# Patient Record
Sex: Female | Born: 1965
Health system: Southern US, Community
[De-identification: ages and names within clinical notes are randomized; demographics above are authoritative.]

## PROBLEM LIST (undated history)

## (undated) DIAGNOSIS — L732 Hidradenitis suppurativa: Secondary | ICD-10-CM

## (undated) DIAGNOSIS — E119 Type 2 diabetes mellitus without complications: Secondary | ICD-10-CM

## (undated) DIAGNOSIS — L0291 Cutaneous abscess, unspecified: Secondary | ICD-10-CM

## (undated) HISTORY — PX: BACK SURGERY: SHX140

## (undated) HISTORY — DX: Cutaneous abscess, unspecified: L02.91

## (undated) HISTORY — DX: Hidradenitis suppurativa: L73.2

---

## 1999-08-15 ENCOUNTER — Other Ambulatory Visit: Admission: RE | Admit: 1999-08-15 | Discharge: 1999-08-15 | Payer: Self-pay | Admitting: Family Medicine

## 2000-04-28 ENCOUNTER — Emergency Department (HOSPITAL_COMMUNITY): Admission: EM | Admit: 2000-04-28 | Discharge: 2000-04-28 | Payer: Self-pay | Admitting: Emergency Medicine

## 2002-07-23 ENCOUNTER — Inpatient Hospital Stay (HOSPITAL_COMMUNITY): Admission: EM | Admit: 2002-07-23 | Discharge: 2002-07-25 | Payer: Self-pay | Admitting: Psychiatry

## 2003-07-04 ENCOUNTER — Emergency Department (HOSPITAL_COMMUNITY): Admission: EM | Admit: 2003-07-04 | Discharge: 2003-07-04 | Payer: Self-pay | Admitting: Emergency Medicine

## 2011-06-01 DIAGNOSIS — L0291 Cutaneous abscess, unspecified: Secondary | ICD-10-CM | POA: Diagnosis not present

## 2011-06-01 DIAGNOSIS — E1110 Type 2 diabetes mellitus with ketoacidosis without coma: Secondary | ICD-10-CM | POA: Diagnosis not present

## 2011-06-01 DIAGNOSIS — L03319 Cellulitis of trunk, unspecified: Secondary | ICD-10-CM | POA: Diagnosis not present

## 2011-06-01 DIAGNOSIS — F17200 Nicotine dependence, unspecified, uncomplicated: Secondary | ICD-10-CM | POA: Diagnosis not present

## 2011-06-01 DIAGNOSIS — E875 Hyperkalemia: Secondary | ICD-10-CM | POA: Diagnosis not present

## 2011-06-01 DIAGNOSIS — R7309 Other abnormal glucose: Secondary | ICD-10-CM | POA: Diagnosis not present

## 2011-06-01 DIAGNOSIS — A4102 Sepsis due to Methicillin resistant Staphylococcus aureus: Secondary | ICD-10-CM | POA: Diagnosis present

## 2011-06-01 DIAGNOSIS — Z9119 Patient's noncompliance with other medical treatment and regimen: Secondary | ICD-10-CM | POA: Diagnosis not present

## 2011-06-01 DIAGNOSIS — E1310 Other specified diabetes mellitus with ketoacidosis without coma: Secondary | ICD-10-CM | POA: Diagnosis not present

## 2011-06-01 DIAGNOSIS — A419 Sepsis, unspecified organism: Secondary | ICD-10-CM | POA: Diagnosis not present

## 2011-06-01 DIAGNOSIS — L02219 Cutaneous abscess of trunk, unspecified: Secondary | ICD-10-CM | POA: Diagnosis not present

## 2011-06-01 DIAGNOSIS — Z79899 Other long term (current) drug therapy: Secondary | ICD-10-CM | POA: Diagnosis not present

## 2011-06-06 DIAGNOSIS — L03319 Cellulitis of trunk, unspecified: Secondary | ICD-10-CM | POA: Diagnosis not present

## 2011-06-06 DIAGNOSIS — Z794 Long term (current) use of insulin: Secondary | ICD-10-CM | POA: Diagnosis not present

## 2011-06-06 DIAGNOSIS — L02219 Cutaneous abscess of trunk, unspecified: Secondary | ICD-10-CM | POA: Diagnosis not present

## 2011-06-06 DIAGNOSIS — E119 Type 2 diabetes mellitus without complications: Secondary | ICD-10-CM | POA: Diagnosis not present

## 2011-06-06 DIAGNOSIS — A419 Sepsis, unspecified organism: Secondary | ICD-10-CM | POA: Diagnosis not present

## 2011-06-06 DIAGNOSIS — A4902 Methicillin resistant Staphylococcus aureus infection, unspecified site: Secondary | ICD-10-CM | POA: Diagnosis not present

## 2011-06-07 DIAGNOSIS — A4902 Methicillin resistant Staphylococcus aureus infection, unspecified site: Secondary | ICD-10-CM | POA: Diagnosis not present

## 2011-06-07 DIAGNOSIS — Z794 Long term (current) use of insulin: Secondary | ICD-10-CM | POA: Diagnosis not present

## 2011-06-07 DIAGNOSIS — L03319 Cellulitis of trunk, unspecified: Secondary | ICD-10-CM | POA: Diagnosis not present

## 2011-06-07 DIAGNOSIS — A419 Sepsis, unspecified organism: Secondary | ICD-10-CM | POA: Diagnosis not present

## 2011-06-08 DIAGNOSIS — A4902 Methicillin resistant Staphylococcus aureus infection, unspecified site: Secondary | ICD-10-CM | POA: Diagnosis not present

## 2011-06-08 DIAGNOSIS — A419 Sepsis, unspecified organism: Secondary | ICD-10-CM | POA: Diagnosis not present

## 2011-06-08 DIAGNOSIS — L02219 Cutaneous abscess of trunk, unspecified: Secondary | ICD-10-CM | POA: Diagnosis not present

## 2011-06-08 DIAGNOSIS — Z794 Long term (current) use of insulin: Secondary | ICD-10-CM | POA: Diagnosis not present

## 2011-06-12 DIAGNOSIS — L02219 Cutaneous abscess of trunk, unspecified: Secondary | ICD-10-CM | POA: Diagnosis not present

## 2011-06-12 DIAGNOSIS — Z794 Long term (current) use of insulin: Secondary | ICD-10-CM | POA: Diagnosis not present

## 2011-06-12 DIAGNOSIS — A4902 Methicillin resistant Staphylococcus aureus infection, unspecified site: Secondary | ICD-10-CM | POA: Diagnosis not present

## 2011-06-12 DIAGNOSIS — A419 Sepsis, unspecified organism: Secondary | ICD-10-CM | POA: Diagnosis not present

## 2011-06-13 DIAGNOSIS — E119 Type 2 diabetes mellitus without complications: Secondary | ICD-10-CM | POA: Diagnosis not present

## 2011-06-15 DIAGNOSIS — IMO0002 Reserved for concepts with insufficient information to code with codable children: Secondary | ICD-10-CM | POA: Diagnosis not present

## 2011-06-15 DIAGNOSIS — S21209A Unspecified open wound of unspecified back wall of thorax without penetration into thoracic cavity, initial encounter: Secondary | ICD-10-CM | POA: Diagnosis not present

## 2011-06-15 DIAGNOSIS — L988 Other specified disorders of the skin and subcutaneous tissue: Secondary | ICD-10-CM | POA: Diagnosis not present

## 2011-06-19 DIAGNOSIS — A419 Sepsis, unspecified organism: Secondary | ICD-10-CM | POA: Diagnosis not present

## 2011-06-19 DIAGNOSIS — Z794 Long term (current) use of insulin: Secondary | ICD-10-CM | POA: Diagnosis not present

## 2011-06-19 DIAGNOSIS — A4902 Methicillin resistant Staphylococcus aureus infection, unspecified site: Secondary | ICD-10-CM | POA: Diagnosis not present

## 2011-06-19 DIAGNOSIS — L03319 Cellulitis of trunk, unspecified: Secondary | ICD-10-CM | POA: Diagnosis not present

## 2011-06-20 DIAGNOSIS — E119 Type 2 diabetes mellitus without complications: Secondary | ICD-10-CM | POA: Diagnosis not present

## 2011-06-22 DIAGNOSIS — Z794 Long term (current) use of insulin: Secondary | ICD-10-CM | POA: Diagnosis not present

## 2011-06-22 DIAGNOSIS — L03319 Cellulitis of trunk, unspecified: Secondary | ICD-10-CM | POA: Diagnosis not present

## 2011-06-22 DIAGNOSIS — A419 Sepsis, unspecified organism: Secondary | ICD-10-CM | POA: Diagnosis not present

## 2011-06-22 DIAGNOSIS — A4902 Methicillin resistant Staphylococcus aureus infection, unspecified site: Secondary | ICD-10-CM | POA: Diagnosis not present

## 2011-06-27 DIAGNOSIS — E119 Type 2 diabetes mellitus without complications: Secondary | ICD-10-CM | POA: Diagnosis not present

## 2011-06-28 DIAGNOSIS — Z794 Long term (current) use of insulin: Secondary | ICD-10-CM | POA: Diagnosis not present

## 2011-06-28 DIAGNOSIS — A4902 Methicillin resistant Staphylococcus aureus infection, unspecified site: Secondary | ICD-10-CM | POA: Diagnosis not present

## 2011-06-28 DIAGNOSIS — L03319 Cellulitis of trunk, unspecified: Secondary | ICD-10-CM | POA: Diagnosis not present

## 2011-06-28 DIAGNOSIS — A419 Sepsis, unspecified organism: Secondary | ICD-10-CM | POA: Diagnosis not present

## 2011-06-28 DIAGNOSIS — L02219 Cutaneous abscess of trunk, unspecified: Secondary | ICD-10-CM | POA: Diagnosis not present

## 2011-07-04 DIAGNOSIS — A419 Sepsis, unspecified organism: Secondary | ICD-10-CM | POA: Diagnosis not present

## 2011-07-04 DIAGNOSIS — A4902 Methicillin resistant Staphylococcus aureus infection, unspecified site: Secondary | ICD-10-CM | POA: Diagnosis not present

## 2011-07-04 DIAGNOSIS — Z794 Long term (current) use of insulin: Secondary | ICD-10-CM | POA: Diagnosis not present

## 2011-07-04 DIAGNOSIS — L02219 Cutaneous abscess of trunk, unspecified: Secondary | ICD-10-CM | POA: Diagnosis not present

## 2011-07-11 DIAGNOSIS — L03319 Cellulitis of trunk, unspecified: Secondary | ICD-10-CM | POA: Diagnosis not present

## 2011-07-11 DIAGNOSIS — L02219 Cutaneous abscess of trunk, unspecified: Secondary | ICD-10-CM | POA: Diagnosis not present

## 2011-07-11 DIAGNOSIS — E119 Type 2 diabetes mellitus without complications: Secondary | ICD-10-CM | POA: Diagnosis not present

## 2011-07-30 DIAGNOSIS — E119 Type 2 diabetes mellitus without complications: Secondary | ICD-10-CM | POA: Diagnosis not present

## 2011-09-05 DIAGNOSIS — E119 Type 2 diabetes mellitus without complications: Secondary | ICD-10-CM | POA: Diagnosis not present

## 2011-10-04 DIAGNOSIS — E119 Type 2 diabetes mellitus without complications: Secondary | ICD-10-CM | POA: Diagnosis not present

## 2012-09-01 ENCOUNTER — Inpatient Hospital Stay (HOSPITAL_COMMUNITY)
Admission: EM | Admit: 2012-09-01 | Discharge: 2012-09-04 | DRG: 871 | Disposition: A | Discharge status: Home Health | Payer: Medicare Other | Attending: Internal Medicine | Admitting: Internal Medicine

## 2012-09-01 ENCOUNTER — Encounter (HOSPITAL_COMMUNITY): Payer: Self-pay | Admitting: *Deleted

## 2012-09-01 DIAGNOSIS — R000 Tachycardia, unspecified: Secondary | ICD-10-CM | POA: Diagnosis present

## 2012-09-01 DIAGNOSIS — L03319 Cellulitis of trunk, unspecified: Secondary | ICD-10-CM | POA: Diagnosis not present

## 2012-09-01 DIAGNOSIS — E119 Type 2 diabetes mellitus without complications: Secondary | ICD-10-CM

## 2012-09-01 DIAGNOSIS — Z9119 Patient's noncompliance with other medical treatment and regimen: Secondary | ICD-10-CM

## 2012-09-01 DIAGNOSIS — E876 Hypokalemia: Secondary | ICD-10-CM | POA: Diagnosis present

## 2012-09-01 DIAGNOSIS — L0291 Cutaneous abscess, unspecified: Secondary | ICD-10-CM | POA: Diagnosis not present

## 2012-09-01 DIAGNOSIS — E872 Acidosis, unspecified: Secondary | ICD-10-CM

## 2012-09-01 DIAGNOSIS — L02219 Cutaneous abscess of trunk, unspecified: Secondary | ICD-10-CM | POA: Diagnosis present

## 2012-09-01 DIAGNOSIS — A419 Sepsis, unspecified organism: Secondary | ICD-10-CM | POA: Diagnosis present

## 2012-09-01 DIAGNOSIS — E1110 Type 2 diabetes mellitus with ketoacidosis without coma: Secondary | ICD-10-CM | POA: Diagnosis not present

## 2012-09-01 DIAGNOSIS — E871 Hypo-osmolality and hyponatremia: Secondary | ICD-10-CM | POA: Diagnosis present

## 2012-09-01 DIAGNOSIS — Z794 Long term (current) use of insulin: Secondary | ICD-10-CM | POA: Diagnosis not present

## 2012-09-01 DIAGNOSIS — Z91199 Patient's noncompliance with other medical treatment and regimen due to unspecified reason: Secondary | ICD-10-CM

## 2012-09-01 DIAGNOSIS — E1310 Other specified diabetes mellitus with ketoacidosis without coma: Secondary | ICD-10-CM | POA: Diagnosis present

## 2012-09-01 DIAGNOSIS — E8729 Other acidosis: Secondary | ICD-10-CM

## 2012-09-01 HISTORY — DX: Type 2 diabetes mellitus without complications: E11.9

## 2012-09-01 LAB — CBC WITH DIFFERENTIAL/PLATELET
Basophils Absolute: 0 10*3/uL (ref 0.0–0.1)
Basophils Relative: 0 % (ref 0–1)
Eosinophils Absolute: 0 10*3/uL (ref 0.0–0.7)
Eosinophils Relative: 0 % (ref 0–5)
HCT: 41 % (ref 36.0–46.0)
Hemoglobin: 13.7 g/dL (ref 12.0–15.0)
Lymphocytes Relative: 11 % — ABNORMAL LOW (ref 12–46)
Lymphs Abs: 1.8 10*3/uL (ref 0.7–4.0)
MCH: 27 pg (ref 26.0–34.0)
MCHC: 33.4 g/dL (ref 30.0–36.0)
MCV: 80.9 fL (ref 78.0–100.0)
Monocytes Absolute: 1.1 10*3/uL — ABNORMAL HIGH (ref 0.1–1.0)
Monocytes Relative: 7 % (ref 3–12)
Neutro Abs: 13.7 10*3/uL — ABNORMAL HIGH (ref 1.7–7.7)
Neutrophils Relative %: 82 % — ABNORMAL HIGH (ref 43–77)
Platelets: 317 10*3/uL (ref 150–400)
RBC: 5.07 MIL/uL (ref 3.87–5.11)
RDW: 13 % (ref 11.5–15.5)
WBC: 16.7 10*3/uL — ABNORMAL HIGH (ref 4.0–10.5)

## 2012-09-01 LAB — COMPREHENSIVE METABOLIC PANEL
ALT: 11 U/L (ref 0–35)
AST: 12 U/L (ref 0–37)
Albumin: 3.7 g/dL (ref 3.5–5.2)
Alkaline Phosphatase: 131 U/L — ABNORMAL HIGH (ref 39–117)
BUN: 14 mg/dL (ref 6–23)
CO2: 11 mEq/L — ABNORMAL LOW (ref 19–32)
Calcium: 9.7 mg/dL (ref 8.4–10.5)
Chloride: 88 mEq/L — ABNORMAL LOW (ref 96–112)
Creatinine, Ser: 0.61 mg/dL (ref 0.50–1.10)
GFR calc Af Amer: >90 mL/min (ref >90)
GFR calc non Af Amer: >90 mL/min (ref >90)
Glucose, Bld: 394 mg/dL — ABNORMAL HIGH (ref 70–99)
Potassium: 4.3 mEq/L (ref 3.5–5.1)
Sodium: 127 mEq/L — ABNORMAL LOW (ref 135–145)
Total Bilirubin: 0.4 mg/dL (ref 0.3–1.2)
Total Protein: 9 g/dL — ABNORMAL HIGH (ref 6.0–8.3)

## 2012-09-01 LAB — BASIC METABOLIC PANEL
BUN: 11 mg/dL (ref 6–23)
Calcium: 9.1 mg/dL (ref 8.4–10.5)
GFR calc Af Amer: >90 mL/min (ref >90)
GFR calc non Af Amer: >90 mL/min (ref >90)
Glucose, Bld: 348 mg/dL — ABNORMAL HIGH (ref 70–99)
Potassium: 4.2 mEq/L (ref 3.5–5.1)
Sodium: 129 mEq/L — ABNORMAL LOW (ref 135–145)

## 2012-09-01 LAB — LACTIC ACID, PLASMA: Lactic Acid, Venous: 1.3 mmol/L (ref 0.5–2.2)

## 2012-09-01 LAB — ETHANOL: Alcohol, Ethyl (B): <11 mg/dL (ref 0–11)

## 2012-09-01 LAB — GLUCOSE, CAPILLARY: Glucose-Capillary: 373 mg/dL — ABNORMAL HIGH (ref 70–99)

## 2012-09-01 LAB — SALICYLATE LEVEL: Salicylate Lvl: 0 mg/dL — ABNORMAL LOW (ref 2.8–20.0)

## 2012-09-01 MED ORDER — LIDOCAINE-EPINEPHRINE (PF) 2 %-1:200000 IJ SOLN
INTRAMUSCULAR | Status: AC
  Administered 2012-09-01: 20 mL
  Filled 2012-09-01: qty 20

## 2012-09-01 MED ORDER — SODIUM CHLORIDE 0.9 % IV BOLUS (SEPSIS)
1000.0000 mL | Freq: Once | INTRAVENOUS | Status: AC
  Administered 2012-09-01: 1000 mL via INTRAVENOUS

## 2012-09-01 MED ORDER — HYDROMORPHONE HCL PF 1 MG/ML IJ SOLN
1.0000 mg | Freq: Once | INTRAMUSCULAR | Status: AC
  Administered 2012-09-01: 1 mg via INTRAVENOUS
  Filled 2012-09-01: qty 1

## 2012-09-01 MED ORDER — INSULIN ASPART 100 UNIT/ML ~~LOC~~ SOLN
8.0000 [IU] | Freq: Once | SUBCUTANEOUS | Status: AC
  Administered 2012-09-01: 8 [IU] via SUBCUTANEOUS
  Filled 2012-09-01: qty 1

## 2012-09-01 MED ORDER — VANCOMYCIN HCL IN DEXTROSE 1-5 GM/200ML-% IV SOLN
1000.0000 mg | Freq: Once | INTRAVENOUS | Status: AC
  Administered 2012-09-01: 1000 mg via INTRAVENOUS
  Filled 2012-09-01: qty 200

## 2012-09-01 NOTE — ED Notes (Signed)
Stopped taking her meds for diabetes 6 months ago

## 2012-09-01 NOTE — ED Notes (Signed)
edpa in with pt 

## 2012-09-01 NOTE — ED Provider Notes (Signed)
History    This chart was scribed for Benny Lennert, MD by Leone Payor, ED Scribe. This patient was seen in room APA18/APA18 and the patient's care was started 6:36 PM.   CSN: [id number]  Arrival date & time 09/01/12  1658   First MD Initiated Contact with Patient 09/01/12 1833      Chief Complaint  Patient presents with  . Abscess     Patient is a 47 y.o. female presenting with abscess. The history is provided by the patient. No language interpreter was used.  Abscess Location:  Torso Torso abscess location:  Lower back Size:  4x3 cm Abscess quality: draining, induration and redness   Duration:  4 days Progression:  Worsening Chronicity:  New Context: diabetes   Associated symptoms: no fatigue, no headaches and no vomiting     HPI Comments: Kellie Pearson is a 47 y.o. female who presents to the Emergency Department complaining of an ongoing, gradually worsening abscess to the R side of back starting about 4 days ago. Pt states she is a diabetic and her blood sugar has been running in the 300-400's. She denies any vomiting. Pt has h/o DM.    Past Medical History  Diagnosis Date  . Diabetes mellitus without complication     No past surgical history on file.  No family history on file.  History  Substance Use Topics  . Smoking status: Not on file  . Smokeless tobacco: Not on file  . Alcohol Use: Not on file    OB History   Grav Para Term Preterm Abortions TAB SAB Ect Mult Living                  Review of Systems  Constitutional: Negative for appetite change and fatigue.  HENT: Negative for congestion, sinus pressure and ear discharge.   Eyes: Negative for discharge.  Respiratory: Negative for cough.   Cardiovascular: Negative for chest pain.  Gastrointestinal: Negative for vomiting, abdominal pain and diarrhea.  Genitourinary: Negative for frequency and hematuria.  Musculoskeletal: Negative for back pain.  Skin: Positive for wound. Negative for rash.   Neurological: Negative for seizures and headaches.  Psychiatric/Behavioral: Negative for hallucinations.    Allergies  Review of patient's allergies indicates not on file.  Home Medications  No current outpatient prescriptions on file.  BP 124/85  Pulse 132  Temp(Src) 98.8 F (37.1 C) (Oral)  Resp 22  Ht 5\' 8"  (1.727 m)  Wt 160 lb (72.576 kg)  BMI 24.33 kg/m2  SpO2 100%  LMP 08/12/2012  Physical Exam  Constitutional: She is oriented to person, place, and time. She appears well-developed.  HENT:  Head: Normocephalic.  Eyes: Conjunctivae are normal.  Neck: No tracheal deviation present.  Cardiovascular:  No murmur heard. Musculoskeletal: Normal range of motion.  Neurological: She is oriented to person, place, and time.  Skin: Skin is warm.  R lower back has a 4x3 cm area of induration, tenderness, redness, and pus draining from it.  Psychiatric: She has a normal mood and affect.    ED Course  Procedures (including critical care time)  DIAGNOSTIC STUDIES: Oxygen Saturation is 100% on room air, normal by my interpretation.    COORDINATION OF CARE: 6:37 PM Discussed treatment plan with pt at bedside and pt agreed to plan.   Results for orders placed during the hospital encounter of 09/01/12  GLUCOSE, CAPILLARY      Result Value Range   Glucose-Capillary 373 (*) 70 - 99 mg/dL  CBC WITH DIFFERENTIAL      Result Value Range   WBC 16.7 (*) 4.0 - 10.5 K/uL   RBC 5.07  3.87 - 5.11 MIL/uL   Hemoglobin 13.7  12.0 - 15.0 g/dL   HCT 14.7  82.9 - 56.2 %   MCV 80.9  78.0 - 100.0 fL   MCH 27.0  26.0 - 34.0 pg   MCHC 33.4  30.0 - 36.0 g/dL   RDW 13.0  86.5 - 78.4 %   Platelets 317  150 - 400 K/uL   Neutrophils Relative 82 (*) 43 - 77 %   Neutro Abs 13.7 (*) 1.7 - 7.7 K/uL   Lymphocytes Relative 11 (*) 12 - 46 %   Lymphs Abs 1.8  0.7 - 4.0 K/uL   Monocytes Relative 7  3 - 12 %   Monocytes Absolute 1.1 (*) 0.1 - 1.0 K/uL   Eosinophils Relative 0  0 - 5 %   Eosinophils  Absolute 0.0  0.0 - 0.7 K/uL   Basophils Relative 0  0 - 1 %   Basophils Absolute 0.0  0.0 - 0.1 K/uL  COMPREHENSIVE METABOLIC PANEL      Result Value Range   Sodium 127 (*) 135 - 145 mEq/L   Potassium 4.3  3.5 - 5.1 mEq/L   Chloride 88 (*) 96 - 112 mEq/L   CO2 11 (*) 19 - 32 mEq/L   Glucose, Bld 394 (*) 70 - 99 mg/dL   BUN 14  6 - 23 mg/dL   Creatinine, Ser 6.96  0.50 - 1.10 mg/dL   Calcium 9.7  8.4 - 29.5 mg/dL   Total Protein 9.0 (*) 6.0 - 8.3 g/dL   Albumin 3.7  3.5 - 5.2 g/dL   AST 12  0 - 37 U/L   ALT 11  0 - 35 U/L   Alkaline Phosphatase 131 (*) 39 - 117 U/L   Total Bilirubin 0.4  0.3 - 1.2 mg/dL   GFR calc non Af Amer >90  >90 mL/min   GFR calc Af Amer >90  >90 mL/min     No results found.    No diagnosis found.    MDM    The chart was scribed for me under my direct supervision.  I personally performed the history, physical, and medical decision making and all procedures in the evaluation of this patient.Benny Lennert, MD 09/01/12 [phone]

## 2012-09-01 NOTE — ED Provider Notes (Signed)
INCISION AND DRAINAGE Performed by: Burgess Amor Consent: Verbal consent obtained. Risks and benefits: risks, benefits and alternatives were discussed Type: abscess  Body area: right mid back  Anesthesia: local infiltration  Incision was made with a scalpel.  Local anesthetic: lidocaine 2 with epinephrine Anesthetic total: 7ml  Complexity: complex Blunt dissection to break up loculations  Drainage: purulent  Drainage amount: copious Packing material: 1/2 iodoform gauze  Patient tolerance: Patient tolerated the procedure well with no immediate complications.     Burgess Amor, PA-C 09/01/12 2142

## 2012-09-01 NOTE — ED Notes (Signed)
Abscess to right side of back

## 2012-09-01 NOTE — ED Provider Notes (Signed)
Medical screening examination/treatment/procedure(s) were conducted as a shared visit with non-physician practitioner(s) and myself.  I personally evaluated the patient during the encounter   Benny Lennert, MD 09/01/12 2315

## 2012-09-02 DIAGNOSIS — E1110 Type 2 diabetes mellitus with ketoacidosis without coma: Secondary | ICD-10-CM | POA: Diagnosis present

## 2012-09-02 DIAGNOSIS — L0291 Cutaneous abscess, unspecified: Secondary | ICD-10-CM | POA: Diagnosis not present

## 2012-09-02 DIAGNOSIS — E872 Acidosis: Secondary | ICD-10-CM | POA: Diagnosis present

## 2012-09-02 LAB — BASIC METABOLIC PANEL
BUN: 8 mg/dL (ref 6–23)
CO2: 15 mEq/L — ABNORMAL LOW (ref 19–32)
Calcium: 8.9 mg/dL (ref 8.4–10.5)
Calcium: 8.6 mg/dL (ref 8.4–10.5)
Calcium: 9.2 mg/dL (ref 8.4–10.5)
Chloride: 103 mEq/L (ref 96–112)
Creatinine, Ser: 0.49 mg/dL — ABNORMAL LOW (ref 0.50–1.10)
Creatinine, Ser: 0.58 mg/dL (ref 0.50–1.10)
Creatinine, Ser: 0.6 mg/dL (ref 0.50–1.10)
GFR calc Af Amer: >90 mL/min (ref >90)
GFR calc Af Amer: >90 mL/min (ref >90)
GFR calc Af Amer: >90 mL/min (ref >90)
GFR calc non Af Amer: >90 mL/min (ref >90)
GFR calc non Af Amer: >90 mL/min (ref >90)
GFR calc non Af Amer: >90 mL/min (ref >90)
GFR calc non Af Amer: >90 mL/min (ref >90)
Glucose, Bld: 340 mg/dL — ABNORMAL HIGH (ref 70–99)
Potassium: 4.6 mEq/L (ref 3.5–5.1)
Sodium: 132 mEq/L — ABNORMAL LOW (ref 135–145)
Sodium: 127 mEq/L — ABNORMAL LOW (ref 135–145)
Sodium: 134 mEq/L — ABNORMAL LOW (ref 135–145)

## 2012-09-02 LAB — CBC
HCT: 37.4 % (ref 36.0–46.0)
MCH: 26.7 pg (ref 26.0–34.0)
MCH: 27.2 pg (ref 26.0–34.0)
MCHC: 32.9 g/dL (ref 30.0–36.0)
MCV: 81.1 fL (ref 78.0–100.0)
MCV: 81 fL (ref 78.0–100.0)
Platelets: 311 10*3/uL (ref 150–400)
Platelets: 282 10*3/uL (ref 150–400)
RBC: 4.37 MIL/uL (ref 3.87–5.11)
RDW: 13.3 % (ref 11.5–15.5)
RDW: 13.1 % (ref 11.5–15.5)
WBC: 17.5 10*3/uL — ABNORMAL HIGH (ref 4.0–10.5)

## 2012-09-02 LAB — URINE MICROSCOPIC-ADD ON

## 2012-09-02 LAB — BLOOD GAS, ARTERIAL
Acid-base deficit: 17.5 mmol/L — ABNORMAL HIGH (ref 0.0–2.0)
Drawn by: 22223
FIO2: 21 %
O2 Saturation: 98.8 %
Patient temperature: 37
TCO2: 7.6 mmol/L (ref 0–100)

## 2012-09-02 LAB — URINALYSIS, ROUTINE W REFLEX MICROSCOPIC
Bilirubin Urine: NEGATIVE
Nitrite: NEGATIVE
Specific Gravity, Urine: >1.03 — ABNORMAL HIGH (ref 1.005–1.030)
Urobilinogen, UA: 0.2 mg/dL (ref 0.0–1.0)

## 2012-09-02 LAB — GLUCOSE, CAPILLARY
Glucose-Capillary: 327 mg/dL — ABNORMAL HIGH (ref 70–99)
Glucose-Capillary: 309 mg/dL — ABNORMAL HIGH (ref 70–99)
Glucose-Capillary: 235 mg/dL — ABNORMAL HIGH (ref 70–99)
Glucose-Capillary: 134 mg/dL — ABNORMAL HIGH (ref 70–99)
Glucose-Capillary: 174 mg/dL — ABNORMAL HIGH (ref 70–99)
Glucose-Capillary: 84 mg/dL (ref 70–99)

## 2012-09-02 LAB — MRSA PCR SCREENING: MRSA by PCR: NEGATIVE

## 2012-09-02 LAB — RAPID URINE DRUG SCREEN, HOSP PERFORMED
Barbiturates: NOT DETECTED
Tetrahydrocannabinol: NOT DETECTED

## 2012-09-02 MED ORDER — ONDANSETRON HCL 4 MG/2ML IJ SOLN: 4.0000 mg | Freq: Four times a day (QID) | INTRAMUSCULAR | Status: DC | PRN

## 2012-09-02 MED ORDER — VANCOMYCIN HCL IN DEXTROSE 1-5 GM/200ML-% IV SOLN
INTRAVENOUS | Status: AC
  Filled 2012-09-02: qty 200

## 2012-09-02 MED ORDER — VANCOMYCIN HCL IN DEXTROSE 1-5 GM/200ML-% IV SOLN
1000.0000 mg | Freq: Once | INTRAVENOUS | Status: AC
  Administered 2012-09-02: 1000 mg via INTRAVENOUS
  Filled 2012-09-02: qty 200

## 2012-09-02 MED ORDER — SENNA 8.6 MG PO TABS
1.0000 | ORAL_TABLET | Freq: Two times a day (BID) | ORAL | Status: DC
  Administered 2012-09-02 – 2012-09-04 (×4): 8.6 mg via ORAL
  Filled 2012-09-02 (×5): qty 1

## 2012-09-02 MED ORDER — MORPHINE SULFATE 2 MG/ML IJ SOLN: 2.0000 mg | INTRAMUSCULAR | Status: DC | PRN

## 2012-09-02 MED ORDER — INSULIN ASPART 100 UNIT/ML ~~LOC~~ SOLN: 0.0000 [IU] | Freq: Three times a day (TID) | SUBCUTANEOUS | Status: DC

## 2012-09-02 MED ORDER — INSULIN ASPART 100 UNIT/ML ~~LOC~~ SOLN: 0.0000 [IU] | Freq: Every day | SUBCUTANEOUS | Status: DC

## 2012-09-02 MED ORDER — SODIUM CHLORIDE 0.9 % IV SOLN: INTRAVENOUS | Status: DC

## 2012-09-02 MED ORDER — INSULIN GLARGINE 100 UNIT/ML ~~LOC~~ SOLN
10.0000 [IU] | Freq: Every day | SUBCUTANEOUS | Status: DC
  Administered 2012-09-03: 10 [IU] via SUBCUTANEOUS
  Filled 2012-09-02 (×2): qty 0.1

## 2012-09-02 MED ORDER — DEXTROSE 50 % IV SOLN: 25.0000 mL | INTRAVENOUS | Status: DC | PRN

## 2012-09-02 MED ORDER — HEPARIN SODIUM (PORCINE) 5000 UNIT/ML IJ SOLN
5000.0000 [IU] | Freq: Three times a day (TID) | INTRAMUSCULAR | Status: DC
  Administered 2012-09-02 – 2012-09-04 (×6): 5000 [IU] via SUBCUTANEOUS
  Filled 2012-09-02 (×6): qty 1

## 2012-09-02 MED ORDER — ZOLPIDEM TARTRATE 5 MG PO TABS: 5.0000 mg | ORAL_TABLET | Freq: Every evening | ORAL | Status: DC | PRN

## 2012-09-02 MED ORDER — ALUM & MAG HYDROXIDE-SIMETH 200-200-20 MG/5ML PO SUSP: 30.0000 mL | Freq: Four times a day (QID) | ORAL | Status: DC | PRN

## 2012-09-02 MED ORDER — INSULIN GLARGINE 100 UNIT/ML ~~LOC~~ SOLN
10.0000 [IU] | Freq: Every day | SUBCUTANEOUS | Status: DC
  Filled 2012-09-02: qty 0.1

## 2012-09-02 MED ORDER — DEXTROSE-NACL 5-0.45 % IV SOLN
INTRAVENOUS | Status: DC
  Administered 2012-09-02 – 2012-09-03 (×2): via INTRAVENOUS

## 2012-09-02 MED ORDER — OXYCODONE HCL 5 MG PO TABS
5.0000 mg | ORAL_TABLET | ORAL | Status: DC | PRN
  Administered 2012-09-02 – 2012-09-04 (×3): 5 mg via ORAL
  Filled 2012-09-02 (×4): qty 1

## 2012-09-02 MED ORDER — SODIUM CHLORIDE 0.9 % IV SOLN
INTRAVENOUS | Status: DC
  Administered 2012-09-02: 06:00:00 via INTRAVENOUS

## 2012-09-02 MED ORDER — SODIUM CHLORIDE 0.9 % IV SOLN
INTRAVENOUS | Status: DC
  Administered 2012-09-02: 09:00:00 via INTRAVENOUS
  Administered 2012-09-03: 7.6 [IU]/h via INTRAVENOUS
  Filled 2012-09-02: qty 1

## 2012-09-02 MED ORDER — ENOXAPARIN SODIUM 40 MG/0.4ML ~~LOC~~ SOLN: 40.0000 mg | SUBCUTANEOUS | Status: DC

## 2012-09-02 MED ORDER — ONDANSETRON HCL 4 MG PO TABS: 4.0000 mg | ORAL_TABLET | Freq: Four times a day (QID) | ORAL | Status: DC | PRN

## 2012-09-02 MED ORDER — LIVING WELL WITH DIABETES BOOK
Freq: Once | Status: AC
  Administered 2012-09-02: 14:00:00
  Filled 2012-09-02: qty 1

## 2012-09-02 MED ORDER — INSULIN ASPART 100 UNIT/ML ~~LOC~~ SOLN: 4.0000 [IU] | Freq: Three times a day (TID) | SUBCUTANEOUS | Status: DC

## 2012-09-02 MED ORDER — VANCOMYCIN HCL IN DEXTROSE 750-5 MG/150ML-% IV SOLN
750.0000 mg | Freq: Three times a day (TID) | INTRAVENOUS | Status: DC
  Administered 2012-09-02 – 2012-09-04 (×6): 750 mg via INTRAVENOUS
  Filled 2012-09-02 (×11): qty 150

## 2012-09-02 MED ORDER — PIPERACILLIN-TAZOBACTAM 3.375 G IVPB
3.3750 g | Freq: Three times a day (TID) | INTRAVENOUS | Status: DC
  Administered 2012-09-02 – 2012-09-04 (×6): 3.375 g via INTRAVENOUS
  Filled 2012-09-02 (×10): qty 50

## 2012-09-02 NOTE — Progress Notes (Signed)
UR Chart Review Completed  

## 2012-09-02 NOTE — Progress Notes (Signed)
Patient with worsening DKA, remains asymptomatic. Started on Glucose Stabilizer protocol and transferred to step down.   Anderson Malta, DO Internal Medicine

## 2012-09-02 NOTE — Progress Notes (Signed)
  RD consulted for nutrition education regarding diabetes.   Lab Results  Component Value Date   HGBA1C 13.8* 09/02/2012    RD provided "Carbohydrate Counting for People with Diabetes" handout from the Academy of Nutrition and Dietetics. Discussed different food groups and their effects on blood sugar, emphasizing carbohydrate-containing foods. Provided list of carbohydrates and recommended serving sizes of common foods.  Discussed importance of controlled and consistent carbohydrate intake throughout the day. Provided examples of ways to balance meals/snacks and encouraged intake of high-fiber, whole grain complex carbohydrates. Teach back method used.  Expect good compliance.  Body mass index is 24.38 kg/(m^2). Pt meets criteria for normal based on current BMI.  Current diet order is CHO Modified Medium (1600-2000 kcal/day) , patient is consuming approximately 75% of meals at this time. Labs and medications reviewed. No further nutrition interventions warranted at this time. RD contact information provided. If additional nutrition issues arise, please re-consult RD.  Royann Shivers MS,RD,LDN,CSG Office: [phone] Pager: [phone]

## 2012-09-02 NOTE — H&P (Signed)
[facility] History and Physical  Kellie Pearson ZOX:[id number] DOB: [birth year] DOA: 09/01/2012  Referring physician: EDP Zammit PCP: No PCP Per Patient  Specialists: none  Chief Complaint: Back Abscess  HPI: Kellie Pearson is a 47 y.o. female with presumed type 2 diabetes who states that she has been otherwise healthy.  She has a completely negative review of systems , tells me that she has been walking one to 2 miles a day for exercise and feeling good  except for the past week where she has had a gradually enlarging skin abscess on her back that has become very painful and large. She has also noticed that her blood sugars have been running in the 300s, she has not been taking her insulin for several months stating that her diabetes was under good control by her estimation that she did not need treatment-she reports financial difficulties purchasing diabetes medications especially insulin. He states that she used to be on sliding scale insulin daily. She reports that she did not have any medical problems or complaints until she first saw a doctor a few months ago, she says she was seen at [facility] practice. He does not know when her diabetes was first diagnosed. She denies any alcohol or substance abuse. She denies any nausea, vomiting, abdominal pain, chest pain, fevers or cough. She also denies any depression or mental health history.  Incision and drainage was performed in the emergency department. Admission was requested initially for her abscess for observation and IV antibiotics but on further review of her laboratories it was noted that she had a metabolic acidosis with a bicarbonate of 11, but was without any overt signs or symptoms of DKA.  Review of Systems: Review of Systems  Constitutional: Negative.  Negative for fever, chills and weight loss.  HENT: Negative.   Eyes: Negative.   Respiratory: Negative.   Cardiovascular: Negative.   Gastrointestinal: Negative.    Genitourinary: Negative.   Musculoskeletal: Negative.        "Feet and ankles feel tight"  Skin: Negative.   Neurological: Negative.   Endo/Heme/Allergies: Negative.   Psychiatric/Behavioral: Negative.     Past Medical History  Diagnosis Date  . Diabetes mellitus without complication    No past surgical history on file. Social History:  has no tobacco, alcohol, and drug history on file. Lives with her parents. Reports financial difficulties.  No Known Allergies  No family history on file. Positive for Diabetes.  Prior to Admission medications   Not on File   Physical Exam: Filed Vitals:   09/01/12 1722 09/01/12 2225 09/02/12 0221  BP: 124/85 119/70 119/81  Pulse: 132 127 125  Temp: 98.8 F (37.1 C) 98.7 F (37.1 C) 98.5 F (36.9 C)  TempSrc: Oral Oral Oral  Resp: 22 18 18   Height: 5\' 8"  (1.727 m)  5\' 8"  (1.727 m)  Weight: 72.576 kg (160 lb)  72.7 kg (160 lb 4.4 oz)  SpO2: 100% 100% 100%     General:  Alert and oriented x3 no acute distress  Eyes: Normal  ENT: Moist no lesions  Neck: Supple no adenopathy  Cardiovascular: Tachycardic  Respiratory: Not tachypnea, normal rate, clear to auscultation bilaterally  Abdomen: Soft nontender  Skin: On her mid right back there is a indurated mass, slightly warm to touch but no obvious redness is status post I&D with a dressing in place but does not have a significant amount of drainage.  Musculoskeletal: Normal  Psychiatric: Flat affect, depressed appearing  Neurologic:  Nonfocal  Labs on Admission:  Basic Metabolic Panel:  Recent Labs Lab 09/01/12 1850 09/01/12 2224  NA 127* 129*  K 4.3 4.2  CL 88* 93*  CO2 11* 10*  GLUCOSE 394* 348*  BUN 14 11  CREATININE 0.61 0.53  CALCIUM 9.7 9.1   Liver Function Tests:  Recent Labs Lab 09/01/12 1850  AST 12  ALT 11  ALKPHOS 131*  BILITOT 0.4  PROT 9.0*  ALBUMIN 3.7  CBC:  Recent Labs Lab 09/01/12 1850  WBC 16.7*  NEUTROABS 13.7*  HGB 13.7   HCT 41.0  MCV 80.9  PLT 317    BNP (last 3 results) No results found for this basename: PROBNP,  in the last 8760 hours CBG:  Recent Labs Lab 09/01/12 1727 09/02/12 0045  GLUCAP 373* 295*    Radiological Exams on Admission: No results found.  EKG: Independently reviewed. Sinus tachycardia.  Assessment/Plan Active Problems:   Metabolic acidosis   Tachycardia   Skin abscess   Increased anion gap metabolic acidosis   DKA (diabetic ketoacidoses)   1. Skin abscess of right mid back, status post I&D performed in the emergency department.  Started on empiric IV vancomycin  May need surgical consult given the size of the abscess, currently not draining and unclear if I&D yielded a significant amount of infectious material. Area still appears quite large and indurated.  Wound culture sent, may need to include anaerobic coverage since she is a poorly controlled diabetic.  2. Early DKA with out any overt clinical signs or symptoms/ presumed Type 2 diabetes, patient largely and denial about the extent of her diabetes and its impact on her health, she has significant educational deficits. Patient admitted to medical floor prior to determination of the extent of her DKA. Appears to be early given the fact that she does not have any clinical signs or symptoms of a ketoacidosis. She is tachycardic which suggest volume depletion, she denies polydipsia she has had frequent urination. Her anion gap on admission was 28 and her bicarbonate was 11.  Aggressive IV fluid resuscitation  At this time we'll not start glucose stabilizer but give her insulin, monitor her BMETevery 4 and CBGs every 2. If she remains acidotic with hyperglycemia or develops symptoms we'll transfer her to the step down unit for close stabilizer protocol.  Consult placed for diabetes education medication assistance   Code Status: Full code  Family Communication: Plan of care discussed with patient in detail.   Disposition Plan: Patient admitted as inpatient, anticipate 2- 3 day hospitalization. Time spent: 70 minutes  [facility]   Pager [phone]  If 7PM-7AM, please contact night-coverage [email or web address] Password [facility] 09/02/2012, 4:32 AM

## 2012-09-02 NOTE — ED Notes (Addendum)
CRITICAL VALUE ALERT  Critical value received:  CO2 17.8  Date of notification:  09/02/12  Time of notification:  0008  Critical value read back:yes  Nurse who received alert:  Thornton Dales, RN  MD notified (1st page):  Phillips Odor  Time of first page:  0009  MD notified (2nd page):  Time of second page:  Responding MD:  Phillips Odor  Time MD responded:  [phone]

## 2012-09-02 NOTE — Progress Notes (Signed)
ANTIBIOTIC CONSULT NOTE - INITIAL  Pharmacy Consult for vancomycin Indication: coverage for I&D'd abscess on right middle back in diabetic patient  No Known Allergies  Patient Measurements: Height: 5\' 8"  (172.7 cm) Weight: 160 lb 4.4 oz (72.7 kg) IBW/kg (Calculated) : 63.9  Vital Signs: Temp: 98.5 F (36.9 C) (05/13 0221) Temp src: Oral (05/13 0221) BP: 119/81 mmHg (05/13 0221) Pulse Rate: 125 (05/13 0221) Intake/Output from previous day:   Intake/Output from this shift:    Labs:  Recent Labs  09/01/12 1850 09/01/12 2224  WBC 16.7*  --   HGB 13.7  --   PLT 317  --   CREATININE 0.61 0.53   Estimated Creatinine Clearance: 89.6 ml/min (by C-G formula based on Cr of 0.53).    Microbiology: No results found for this or any previous visit (from the past 720 hour(s)).  Medical History: Past Medical History  Diagnosis Date  . Diabetes mellitus without complication     Medications:  Scheduled:  . enoxaparin (LOVENOX) injection  40 mg Subcutaneous Q24H  . [COMPLETED]  HYDROmorphone (DILAUDID) injection  1 mg Intravenous Once  . insulin aspart  0-15 Units Subcutaneous TID WC  . insulin aspart  0-5 Units Subcutaneous QHS  . insulin aspart  4 Units Subcutaneous TID WC  . [COMPLETED] insulin aspart  8 Units Subcutaneous Once  . insulin glargine  10 Units Subcutaneous QHS  . [COMPLETED] lidocaine-EPINEPHrine      . senna  1 tablet Oral BID  . [COMPLETED] sodium chloride  1,000 mL Intravenous Once  . [COMPLETED] sodium chloride  1,000 mL Intravenous Once  . [COMPLETED] vancomycin  1,000 mg Intravenous Once   Infusions:  . sodium chloride     PRN: alum & mag hydroxide-simeth, morphine injection, ondansetron (ZOFRAN) IV, ondansetron, oxyCODONE, zolpidem  Assessment: Initial vancomycin 1gm dose given approx 8pm, prior to drainage of abscess.  No other antibiotics noted.  No gram stain / culture results yet.  Goal of Therapy:  Coverage for skin source abscess,  possibly MRSA, in diabetic patient with CrCl wnl for age.  Plan:  1.  Single vancomycin 1gm dose to be repeated at 6am 2.  Clinical pharmacist to assess for possible maintenance regimen. 3.  No note about possible broader coverage with other anitbiotics  Scarlett Presto 09/02/2012,3:27 AM

## 2012-09-02 NOTE — Progress Notes (Signed)
Spoke with pt about diabetes and prior diabetes control.Explained what an A1C is (informed patient we are awaiting results for A1C), basic pathophysiology of DM Type 2, basic home care, importance of checking CBGs and maintaining good CBG control to prevent long-term and short-term complications. According to the patient she used to take insulin based on a sliding scale.  She does not remember what the type or name of the insulin was but she reports that she got her insulin from samples that her doctor provided her with because she could not afford to pay for it.  According to the patient she stopped taking insulin about 6 months ago because her blood sugars were "under control" and she did not feel that she needed to take insulin any longer.  Patient states that she was seeing a doctor at [facility] in the past but she stopped going to them and has not followed up with a doctor in over 6 months.  Patient will need a case management consult to set up a follow up visit with a PCP willing to see patient.    When patient was asked about checking her blood sugar, she reports that she used to check it 3-4 times a day when she was on insulin but she does not check that often now.  She lives with her mother who is a diabetic and she uses her glucometer and testing supplies.  According to the patient she does not need any assistance in acquiring testing supplies.    Patient states that her mother and her grandmother are diabetics and they followed exactly what the doctor told them to do and they still had diabetes related problems.  Stressed importance of compliance with doctor recommendations to reduce risk of developing complications.  Patient states that she feels that "it doesn't really matter if you do what the doctor says because from what I have seen with my own family, problems are still going to happen".  Explained to patient the damage to small and large blood vessels with elevated blood sugars and  provided her with the Living well with Diabetes booklet which we reviewed and discussed.    Discussed Carbohydrate counting and meal planning.  Patient reports that she practices portion control to help control her diabetes with her diet.  She does report drinking regular sodas at times.  Instructed patient on basics of a diabetic diet and will place order for dietitian consult for further diet education.    If patient is discharged home on insulin, she now has Medicare which should cover her insulins.  Patient is reluctant to insulin stating that she "doesn't want to go back to shots".  Informed patient that the doctor will make the proper recommendation for diabetes management but she is likely going to need insulin to control her diabetes.  RNs to provide ongoing basic DM education at bedside with this patient. Will follow up with patient over the next 24-48 hours.  Thanks, Orlando Penner, RN, BSN, CCRN Diabetes Coordinator Inpatient Diabetes Program [phone]

## 2012-09-02 NOTE — Progress Notes (Signed)
ANTIBIOTIC CONSULT NOTE  Pharmacy Consult for Vancomycin & Zosyn Indication: back abscess   No Known Allergies  Patient Measurements: Height: 5\' 8"  (172.7 cm) Weight: 160 lb 4.4 oz (72.7 kg) IBW/kg (Calculated) : 63.9  Vital Signs: Temp: 98.4 F (36.9 C) (05/13 0835) Temp src: Oral (05/13 0835) BP: 106/68 mmHg (05/13 0517) Pulse Rate: 117 (05/13 0517) Intake/Output from previous day:   Intake/Output from this shift:    Labs:  Recent Labs  09/01/12 1850 09/01/12 2224 09/02/12 0512 09/02/12 0513 09/02/12 0639  WBC 16.7*  --   --  18.2* 17.5*  HGB 13.7  --   --  12.3 11.9*  PLT 317  --   --  311 282  CREATININE 0.61 0.53 0.48*  --  0.49*   Estimated Creatinine Clearance: 89.6 ml/min (by C-G formula based on Cr of 0.49).  Microbiology: No results found for this or any previous visit (from the past 720 hour(s)).  Medical History: Past Medical History  Diagnosis Date  . Diabetes mellitus without complication     Medications:  Scheduled:  . [COMPLETED]  HYDROmorphone (DILAUDID) injection  1 mg Intravenous Once  . [COMPLETED] insulin aspart  8 Units Subcutaneous Once  . insulin glargine  10 Units Subcutaneous QHS  . [COMPLETED] lidocaine-EPINEPHrine      . piperacillin-tazobactam (ZOSYN)  IV  3.375 g Intravenous Q8H  . senna  1 tablet Oral BID  . [COMPLETED] sodium chloride  1,000 mL Intravenous Once  . [COMPLETED] sodium chloride  1,000 mL Intravenous Once  . [COMPLETED] vancomycin  1,000 mg Intravenous Once  . [COMPLETED] vancomycin  1,000 mg Intravenous Once  . vancomycin  750 mg Intravenous Q8H  . [DISCONTINUED] enoxaparin (LOVENOX) injection  40 mg Subcutaneous Q24H  . [DISCONTINUED] insulin aspart  0-15 Units Subcutaneous TID WC  . [DISCONTINUED] insulin aspart  0-5 Units Subcutaneous QHS  . [DISCONTINUED] insulin aspart  4 Units Subcutaneous TID WC   Infusions:  . sodium chloride 150 mL/hr at 09/02/12 0531  . dextrose 5 % and 0.45% NaCl    . insulin  (NOVOLIN-R) infusion    . [DISCONTINUED] sodium chloride    . [DISCONTINUED] sodium chloride     PRN: alum & mag hydroxide-simeth, dextrose, morphine injection, ondansetron (ZOFRAN) IV, ondansetron, oxyCODONE, zolpidem  Assessment: 47 yo diabetic F with worsening skin abscess on her back.  S/P I&D on admission and cx data pending.  Renal function at patient's baseline.  Started on Vancomycin in ED.  Zosyn is now being added for broader coverage given hx of diabetes.   Goal of Therapy:  Vancomycin 15-20 mcg/ml  Plan:  1.  Zosyn 3.375gm IV Q8h to be infused over 4hrs 2.  Vancomycin 750mg  IV Q8h 3.  Check Vancomycin trough at steady state 4.  Monitor renal function and cx data   Elson Clan 09/02/2012,9:13 AM

## 2012-09-02 NOTE — Progress Notes (Signed)
[facility]                                                                                Patient Demographics  Kellie Pearson, is a 47 y.o. female, DOB - Aug 15, 1965, WGN:[id number], HQI:[id number]  Admit date - 09/01/2012  Admitting Physician Edsel Petrin, DO  Outpatient Primary MD for the patient is No PCP Per Patient  LOS - 1   Chief Complaint  Patient presents with  . Abscess        Assessment & Plan    1. SIRs with right posterior rib cage skin abscess. Abscess was drained in the ER, she has been placed on IV vancomycin along with Zosyn as she is diabetic, follow cultures results and clinically, we'll request general surgery to evaluate the patient as the abscess site initially was quite large.   2. Diabetes mellitus type 2 patient in DKA. Still has low bicarbonate 9, IV insulin drip per protocol, monitor electrolytes closely. Patient will get diabetic and insulin education, apparently she was on insulin sliding scale before and took herself off of it as she thought she was doing great.  No results found for this basename: HGBA1C    CBG (last 3)   Recent Labs  09/01/12 1727 09/02/12 0045 09/02/12 0515  GLUCAP 373* 295* 327*      3. Hyponatremia due to #2 above, normal saline and monitor.     Code Status: Full  Family Communication: Patient  Disposition Plan: Home   Procedures right posterior rib cage abscess incision and drainage in the ER   Consults  surgery   DVT Prophylaxis    Heparin   Lab Results  Component Value Date   PLT 282 09/02/2012    Medications  Scheduled Meds: . heparin subcutaneous  5,000 Units Subcutaneous Q8H  . insulin glargine  10 Units Subcutaneous QHS  . piperacillin-tazobactam (ZOSYN)  IV  3.375 g Intravenous Q8H  . senna  1 tablet Oral BID  . vancomycin  750 mg Intravenous Q8H   Continuous Infusions: . sodium chloride 150 mL/hr at 09/02/12 0531  . dextrose 5 % and 0.45% NaCl    . insulin (NOVOLIN-R)  infusion     PRN Meds:.alum & mag hydroxide-simeth, dextrose, morphine injection, ondansetron (ZOFRAN) IV, ondansetron, oxyCODONE, zolpidem  Antibiotics    Anti-infectives   Start     Dose/Rate Route Frequency Ordered Stop   09/02/12 1300  vancomycin (VANCOCIN) IVPB 750 mg/150 ml premix     750 mg 150 mL/hr over 60 Minutes Intravenous Every 8 hours 09/02/12 0906     09/02/12 1000  piperacillin-tazobactam (ZOSYN) IVPB 3.375 g     3.375 g 12.5 mL/hr over 240 Minutes Intravenous Every 8 hours 09/02/12 0906     09/02/12 0600  vancomycin (VANCOCIN) IVPB 1000 mg/200 mL premix     1,000 mg 200 mL/hr over 60 Minutes Intravenous  Once 09/02/12 0333 09/02/12 0632   09/01/12 1845  vancomycin (VANCOCIN) IVPB 1000 mg/200 mL premix     1,000 mg 200 mL/hr over 60 Minutes Intravenous  Once 09/01/12 1838 09/01/12 2147       Time Spent in minutes   35   Kellie Pearson  K M.D on 09/02/2012 at 9:23 AM  Between 7am to 7pm - Pager - [phone]  After 7pm go to [email or web address] - password [email or web address]  And look for the night coverage person covering for me after hours  [facility] Office  [phone]    Subjective:   Kellie Pearson today has, No headache, No chest pain, No abdominal pain - No Nausea, No new weakness tingling or numbness, No Cough - SOB.    Objective:   Filed Vitals:   09/01/12 2225 09/02/12 0221 09/02/12 0517 09/02/12 0835  BP: 119/70 119/81 106/68   Pulse: 127 125 117   Temp: 98.7 F (37.1 C) 98.5 F (36.9 C) 98.5 F (36.9 C) 98.4 F (36.9 C)  TempSrc: Oral Oral Oral Oral  Resp: 18 18 17    Height:  5\' 8"  (1.727 m)    Weight:  72.7 kg (160 lb 4.4 oz)    SpO2: 100% 100% 99%     Wt Readings from Last 3 Encounters:  09/02/12 72.7 kg (160 lb 4.4 oz)    No intake or output data in the 24 hours ending 09/02/12 0923  Exam Awake Alert, Oriented X 3, No new F.N deficits, Normal affect [place].AT,PERRAL Supple Neck,No JVD, No cervical lymphadenopathy appriciated.   Symmetrical Chest wall movement, Good air movement bilaterally, CTAB RRR,No Gallops,Rubs or new Murmurs, No Parasternal Heave +ve B.Sounds, Abd Soft, Non tender, No organomegaly appriciated, No rebound - guarding or rigidity. No Cyanosis, Clubbing or edema, No new Rash or bruise , right posterior rib cage abscess site appears stable under bandage. Mild surrounding cellulitis.   Data Review   Micro Results No results found for this or any previous visit (from the past 240 hour(s)).  Radiology Reports No results found.  CBC  Recent Labs Lab 09/01/12 1850 09/02/12 0513 09/02/12 0639  WBC 16.7* 18.2* 17.5*  HGB 13.7 12.3 11.9*  HCT 41.0 37.4 35.4*  PLT 317 311 282  MCV 80.9 81.1 81.0  MCH 27.0 26.7 27.2  MCHC 33.4 32.9 33.6  RDW 13.0 13.3 13.1  LYMPHSABS 1.8  --   --   MONOABS 1.1*  --   --   EOSABS 0.0  --   --   BASOSABS 0.0  --   --     Chemistries   Recent Labs Lab 09/01/12 1850 09/01/12 2224 09/02/12 0512 09/02/12 0639  NA 127* 129* 132* 127*  K 4.3 4.2 4.6 4.2  CL 88* 93* 99 96  CO2 11* 10* <7* 7*  GLUCOSE 394* 348* 340* 372*  BUN 14 11 10 9   CREATININE 0.61 0.53 0.48* 0.49*  CALCIUM 9.7 9.1 8.9 8.6  AST 12  --   --   --   ALT 11  --   --   --   ALKPHOS 131*  --   --   --   BILITOT 0.4  --   --   --    ------------------------------------------------------------------------------------------------------------------ estimated creatinine clearance is 89.6 ml/min (by C-G formula based on Cr of 0.49). ------------------------------------------------------------------------------------------------------------------ No results found for this basename: HGBA1C,  in the last 72 hours ------------------------------------------------------------------------------------------------------------------ No results found for this basename: CHOL, HDL, LDLCALC, TRIG, CHOLHDL, LDLDIRECT,  in the last 72  hours ------------------------------------------------------------------------------------------------------------------ No results found for this basename: TSH, T4TOTAL, FREET3, T3FREE, THYROIDAB,  in the last 72 hours ------------------------------------------------------------------------------------------------------------------ No results found for this basename: VITAMINB12, FOLATE, FERRITIN, TIBC, IRON, RETICCTPCT,  in the last 72 hours  Coagulation profile No results found for this basename: INR,  PROTIME,  in the last 168 hours  No results found for this basename: DDIMER,  in the last 72 hours  Cardiac Enzymes No results found for this basename: CK, CKMB, TROPONINI, MYOGLOBIN,  in the last 168 hours ------------------------------------------------------------------------------------------------------------------ No components found with this basename: POCBNP,

## 2012-09-02 NOTE — Progress Notes (Signed)
CRITICAL VALUE ALERT  Critical value received:  CO2 = 5  Date of notification: 09/02/12  Time of notification: 0635  Critical value read back:CO2=5  Nurse who received alert: Marney Doctor  MD notified (1st page): Dr. Phillips Odor ( text message sent)  Time of first page: 0636  MD notified (2nd page):  Time of second page:  Responding MD:    Time MD responded:

## 2012-09-03 ENCOUNTER — Encounter (HOSPITAL_COMMUNITY): Payer: Self-pay

## 2012-09-03 DIAGNOSIS — E872 Acidosis: Secondary | ICD-10-CM

## 2012-09-03 DIAGNOSIS — E119 Type 2 diabetes mellitus without complications: Secondary | ICD-10-CM | POA: Diagnosis not present

## 2012-09-03 DIAGNOSIS — L03319 Cellulitis of trunk, unspecified: Secondary | ICD-10-CM | POA: Diagnosis not present

## 2012-09-03 DIAGNOSIS — E1110 Type 2 diabetes mellitus with ketoacidosis without coma: Secondary | ICD-10-CM

## 2012-09-03 DIAGNOSIS — L0291 Cutaneous abscess, unspecified: Secondary | ICD-10-CM | POA: Diagnosis not present

## 2012-09-03 LAB — BASIC METABOLIC PANEL
BUN: 9 mg/dL (ref 6–23)
BUN: 10 mg/dL (ref 6–23)
CO2: 15 mEq/L — ABNORMAL LOW (ref 19–32)
Calcium: 7.6 mg/dL — ABNORMAL LOW (ref 8.4–10.5)
Chloride: 108 mEq/L (ref 96–112)
Creatinine, Ser: 0.5 mg/dL (ref 0.50–1.10)
Creatinine, Ser: 0.46 mg/dL — ABNORMAL LOW (ref 0.50–1.10)
GFR calc Af Amer: >90 mL/min (ref >90)
GFR calc non Af Amer: >90 mL/min (ref >90)
GFR calc non Af Amer: >90 mL/min (ref >90)
Glucose, Bld: 87 mg/dL (ref 70–99)
Glucose, Bld: 318 mg/dL — ABNORMAL HIGH (ref 70–99)

## 2012-09-03 LAB — GLUCOSE, CAPILLARY
Glucose-Capillary: 243 mg/dL — ABNORMAL HIGH (ref 70–99)
Glucose-Capillary: 231 mg/dL — ABNORMAL HIGH (ref 70–99)
Glucose-Capillary: 187 mg/dL — ABNORMAL HIGH (ref 70–99)
Glucose-Capillary: 233 mg/dL — ABNORMAL HIGH (ref 70–99)
Glucose-Capillary: 126 mg/dL — ABNORMAL HIGH (ref 70–99)
Glucose-Capillary: 277 mg/dL — ABNORMAL HIGH (ref 70–99)

## 2012-09-03 LAB — CBC
Hemoglobin: 11.7 g/dL — ABNORMAL LOW (ref 12.0–15.0)
MCV: 79 fL (ref 78.0–100.0)
Platelets: 325 10*3/uL (ref 150–400)

## 2012-09-03 MED ORDER — INSULIN REGULAR HUMAN 100 UNIT/ML IJ SOLN
INTRAMUSCULAR | Status: AC
  Filled 2012-09-03: qty 3

## 2012-09-03 MED ORDER — INSULIN GLARGINE 100 UNIT/ML ~~LOC~~ SOLN
5.0000 [IU] | Freq: Once | SUBCUTANEOUS | Status: AC
  Administered 2012-09-03: 5 [IU] via SUBCUTANEOUS
  Filled 2012-09-03: qty 0.05

## 2012-09-03 MED ORDER — INSULIN ASPART 100 UNIT/ML ~~LOC~~ SOLN
0.0000 [IU] | Freq: Three times a day (TID) | SUBCUTANEOUS | Status: DC
  Administered 2012-09-03: 8 [IU] via SUBCUTANEOUS
  Administered 2012-09-04: 11 [IU] via SUBCUTANEOUS
  Administered 2012-09-04: 5 [IU] via SUBCUTANEOUS

## 2012-09-03 MED ORDER — INSULIN GLARGINE 100 UNIT/ML ~~LOC~~ SOLN
15.0000 [IU] | Freq: Every day | SUBCUTANEOUS | Status: DC
  Filled 2012-09-03: qty 0.15

## 2012-09-03 MED ORDER — INSULIN GLARGINE 100 UNIT/ML ~~LOC~~ SOLN: 20.0000 [IU] | Freq: Every day | SUBCUTANEOUS | Status: DC

## 2012-09-03 MED ORDER — INSULIN GLARGINE 100 UNIT/ML ~~LOC~~ SOLN
15.0000 [IU] | Freq: Two times a day (BID) | SUBCUTANEOUS | Status: DC
  Administered 2012-09-03: 15 [IU] via SUBCUTANEOUS
  Filled 2012-09-03 (×3): qty 0.15

## 2012-09-03 MED ORDER — POTASSIUM CHLORIDE CRYS ER 20 MEQ PO TBCR
40.0000 meq | EXTENDED_RELEASE_TABLET | Freq: Once | ORAL | Status: AC
  Administered 2012-09-03: 40 meq via ORAL
  Filled 2012-09-03: qty 2

## 2012-09-03 MED ORDER — INSULIN ASPART 100 UNIT/ML ~~LOC~~ SOLN
0.0000 [IU] | Freq: Every day | SUBCUTANEOUS | Status: DC
  Administered 2012-09-03: 2 [IU] via SUBCUTANEOUS

## 2012-09-03 MED ORDER — INSULIN ASPART 100 UNIT/ML ~~LOC~~ SOLN
0.0000 [IU] | Freq: Three times a day (TID) | SUBCUTANEOUS | Status: DC
  Administered 2012-09-03: 2 [IU] via SUBCUTANEOUS
  Administered 2012-09-03: 11 [IU] via SUBCUTANEOUS

## 2012-09-03 MED ORDER — SODIUM CHLORIDE 0.9 % IV SOLN
INTRAVENOUS | Status: AC
  Administered 2012-09-03: 08:00:00 via INTRAVENOUS

## 2012-09-03 MED ORDER — POTASSIUM CHLORIDE CRYS ER 20 MEQ PO TBCR: 40.0000 meq | EXTENDED_RELEASE_TABLET | ORAL | Status: DC | PRN

## 2012-09-03 MED ORDER — INSULIN ASPART 100 UNIT/ML ~~LOC~~ SOLN: 0.0000 [IU] | Freq: Every day | SUBCUTANEOUS | Status: DC

## 2012-09-03 MED ORDER — INSULIN ASPART 100 UNIT/ML ~~LOC~~ SOLN
3.0000 [IU] | Freq: Three times a day (TID) | SUBCUTANEOUS | Status: DC
  Administered 2012-09-03 – 2012-09-04 (×3): 3 [IU] via SUBCUTANEOUS

## 2012-09-03 NOTE — Progress Notes (Signed)
Pt transferred to room 316. Report given to Jessica Butner RN. Vital signs stable at transfer. 

## 2012-09-03 NOTE — Progress Notes (Signed)
Hypokalemia   K replaced  

## 2012-09-03 NOTE — Consult Note (Signed)
Reason for Consult: Back abscess Referring Physician: [facility] hospital[facility]  Kellie Pearson is an 47 y.o. female.  HPI: Patient presented to [facility] with increasing pain in her right upper back. She noted some swelling and suspected abscess. Prior to hospitalization she did not have any discharge or draining. No prehospital management or care for the abscess. When she was evaluated in the emergency department she did have incision and drainage of the abscess was also noted to be in suspected DKA. She was admitted for continued management and evaluation. Since the incision and drainage she's felt much better in the back although she still somewhat tender. She denies any current fevers or chills. No nausea  Past Medical History  Diagnosis Date  . Diabetes mellitus without complication     No past surgical history on file.  No family history on file.  Social History:  has no tobacco, alcohol, and drug history on file.  Allergies: No Known Allergies  Medications:  I have reviewed the patient's current medications. Prior to Admission:  No prescriptions prior to admission   Scheduled: . heparin subcutaneous  5,000 Units Subcutaneous Q8H  . insulin aspart  0-15 Units Subcutaneous TID WC  . insulin aspart  0-5 Units Subcutaneous QHS  . [START ON 09/04/2012] insulin glargine  15 Units Subcutaneous Daily  . piperacillin-tazobactam (ZOSYN)  IV  3.375 g Intravenous Q8H  . senna  1 tablet Oral BID  . vancomycin  750 mg Intravenous Q8H   Continuous:  ZOX:WRUE & mag hydroxide-simeth, dextrose, morphine injection, ondansetron (ZOFRAN) IV, ondansetron, oxyCODONE Anti-infectives   Start     Dose/Rate Route Frequency Ordered Stop   09/02/12 1300  vancomycin (VANCOCIN) IVPB 750 mg/150 ml premix     750 mg 150 mL/hr over 60 Minutes Intravenous Every 8 hours 09/02/12 0906     09/02/12 1000  piperacillin-tazobactam (ZOSYN) IVPB 3.375 g     3.375 g 12.5 mL/hr over 240 Minutes Intravenous  Every 8 hours 09/02/12 0906     09/02/12 0600  vancomycin (VANCOCIN) IVPB 1000 mg/200 mL premix     1,000 mg 200 mL/hr over 60 Minutes Intravenous  Once 09/02/12 0333 09/02/12 0632   09/01/12 1845  vancomycin (VANCOCIN) IVPB 1000 mg/200 mL premix     1,000 mg 200 mL/hr over 60 Minutes Intravenous  Once 09/01/12 1838 09/01/12 2147      Results for orders placed during the hospital encounter of 09/01/12 (from the past 48 hour(s))  GLUCOSE, CAPILLARY     Status: Abnormal   Collection Time    09/01/12  5:27 PM      Result Value Range   Glucose-Capillary 373 (*) 70 - 99 mg/dL  CBC WITH DIFFERENTIAL     Status: Abnormal   Collection Time    09/01/12  6:50 PM      Result Value Range   WBC 16.7 (*) 4.0 - 10.5 K/uL   RBC 5.07  3.87 - 5.11 MIL/uL   Hemoglobin 13.7  12.0 - 15.0 g/dL   HCT 45.4  09.8 - 11.9 %   MCV 80.9  78.0 - 100.0 fL   MCH 27.0  26.0 - 34.0 pg   MCHC 33.4  30.0 - 36.0 g/dL   RDW 14.7  82.9 - 56.2 %   Platelets 317  150 - 400 K/uL   Neutrophils Relative % 82 (*) 43 - 77 %   Neutro Abs 13.7 (*) 1.7 - 7.7 K/uL   Lymphocytes Relative 11 (*) 12 -  46 %   Lymphs Abs 1.8  0.7 - 4.0 K/uL   Monocytes Relative 7  3 - 12 %   Monocytes Absolute 1.1 (*) 0.1 - 1.0 K/uL   Eosinophils Relative 0  0 - 5 %   Eosinophils Absolute 0.0  0.0 - 0.7 K/uL   Basophils Relative 0  0 - 1 %   Basophils Absolute 0.0  0.0 - 0.1 K/uL  COMPREHENSIVE METABOLIC PANEL     Status: Abnormal   Collection Time    09/01/12  6:50 PM      Result Value Range   Sodium 127 (*) 135 - 145 mEq/L   Potassium 4.3  3.5 - 5.1 mEq/L   Chloride 88 (*) 96 - 112 mEq/L   CO2 11 (*) 19 - 32 mEq/L   Glucose, Bld 394 (*) 70 - 99 mg/dL   BUN 14  6 - 23 mg/dL   Creatinine, Ser 0.86  0.50 - 1.10 mg/dL   Calcium 9.7  8.4 - 57.8 mg/dL   Total Protein 9.0 (*) 6.0 - 8.3 g/dL   Albumin 3.7  3.5 - 5.2 g/dL   AST 12  0 - 37 U/L   ALT 11  0 - 35 U/L   Alkaline Phosphatase 131 (*) 39 - 117 U/L   Total Bilirubin 0.4  0.3 - 1.2  mg/dL   GFR calc non Af Amer >90  >90 mL/min   GFR calc Af Amer >90  >90 mL/min   Comment:            The eGFR has been calculated     using the CKD EPI equation.     This calculation has not been     validated in all clinical     situations.     eGFR's persistently     <90 mL/min signify     possible Chronic Kidney Disease.  ETHANOL     Status: None   Collection Time    09/01/12 10:11 PM      Result Value Range   Alcohol, Ethyl (B) <11  0 - 11 mg/dL   Comment:            LOWEST DETECTABLE LIMIT FOR     SERUM ALCOHOL IS 11 mg/dL     FOR MEDICAL PURPOSES ONLY  SALICYLATE LEVEL     Status: Abnormal   Collection Time    09/01/12 10:11 PM      Result Value Range   Salicylate Lvl 0.0 (*) 2.8 - 20.0 mg/dL  BASIC METABOLIC PANEL     Status: Abnormal   Collection Time    09/01/12 10:24 PM      Result Value Range   Sodium 129 (*) 135 - 145 mEq/L   Potassium 4.2  3.5 - 5.1 mEq/L   Chloride 93 (*) 96 - 112 mEq/L   CO2 10 (*) 19 - 32 mEq/L   Comment: CRITICAL RESULT CALLED TO, READ BACK BY AND VERIFIED WITH:     L. HUTCHENS AT 2304 ON 09/01/12 BY S. VANHOORNE   Glucose, Bld 348 (*) 70 - 99 mg/dL   BUN 11  6 - 23 mg/dL   Creatinine, Ser 4.69  0.50 - 1.10 mg/dL   Calcium 9.1  8.4 - 62.9 mg/dL   GFR calc non Af Amer >90  >90 mL/min   GFR calc Af Amer >90  >90 mL/min   Comment:            The eGFR has  been calculated     using the CKD EPI equation.     This calculation has not been     validated in all clinical     situations.     eGFR's persistently     <90 mL/min signify     possible Chronic Kidney Disease.  LACTIC ACID, PLASMA     Status: None   Collection Time    09/01/12 10:24 PM      Result Value Range   Lactic Acid, Venous 1.3  0.5 - 2.2 mmol/L  GLUCOSE, CAPILLARY     Status: Abnormal   Collection Time    09/02/12 12:45 AM      Result Value Range   Glucose-Capillary 295 (*) 70 - 99 mg/dL   Comment 1 Documented in Chart    URINALYSIS, ROUTINE W REFLEX MICROSCOPIC      Status: Abnormal   Collection Time    09/02/12 12:47 AM      Result Value Range   Color, Urine STRAW (*) YELLOW   APPearance CLEAR  CLEAR   Specific Gravity, Urine >1.030 (*) 1.005 - 1.030   pH 5.5  5.0 - 8.0   Glucose, UA 500 (*) NEGATIVE mg/dL   Hgb urine dipstick TRACE (*) NEGATIVE   Bilirubin Urine NEGATIVE  NEGATIVE   Ketones, ur >80 (*) NEGATIVE mg/dL   Protein, ur TRACE (*) NEGATIVE mg/dL   Urobilinogen, UA 0.2  0.0 - 1.0 mg/dL   Nitrite NEGATIVE  NEGATIVE   Leukocytes, UA NEGATIVE  NEGATIVE  URINE RAPID DRUG SCREEN (HOSP PERFORMED)     Status: None   Collection Time    09/02/12 12:47 AM      Result Value Range   Opiates NONE DETECTED  NONE DETECTED   Cocaine NONE DETECTED  NONE DETECTED   Benzodiazepines NONE DETECTED  NONE DETECTED   Amphetamines NONE DETECTED  NONE DETECTED   Tetrahydrocannabinol NONE DETECTED  NONE DETECTED   Barbiturates NONE DETECTED  NONE DETECTED   Comment:            DRUG SCREEN FOR MEDICAL PURPOSES     ONLY.  IF CONFIRMATION IS NEEDED     FOR ANY PURPOSE, NOTIFY LAB     WITHIN 5 DAYS.                LOWEST DETECTABLE LIMITS     FOR URINE DRUG SCREEN     Drug Class       Cutoff (ng/mL)     Amphetamine      1000     Barbiturate      200     Benzodiazepine   200     Tricyclics       300     Opiates          300     Cocaine          300     THC              50  URINE MICROSCOPIC-ADD ON     Status: Abnormal   Collection Time    09/02/12 12:47 AM      Result Value Range   Squamous Epithelial / LPF MANY (*) RARE   WBC, UA 3-6  <3 WBC/hpf   RBC / HPF 0-2  <3 RBC/hpf   Bacteria, UA RARE  RARE   Casts HYALINE CASTS (*) NEGATIVE   Comment: GRANULAR CAST   Urine-Other MUCOUS PRESENT    TSH  Status: None   Collection Time    09/02/12  3:05 AM      Result Value Range   TSH 1.085  0.350 - 4.500 uIU/mL  HEMOGLOBIN A1C     Status: Abnormal   Collection Time    09/02/12  3:05 AM      Result Value Range   Hemoglobin A1C 13.8 (*) <5.7 %     Comment: (NOTE)                                                                               According to the ADA Clinical Practice Recommendations for 2011, when     HbA1c is used as a screening test:      >=6.5%   Diagnostic of Diabetes Mellitus               (if abnormal result is confirmed)     5.7-6.4%   Increased risk of developing Diabetes Mellitus     References:Diagnosis and Classification of Diabetes Mellitus,Diabetes     Care,2011,34(Suppl 1):S62-S69 and Standards of Medical Care in             Diabetes - 2011,Diabetes Care,2011,34 (Suppl 1):S11-S61.   Mean Plasma Glucose 349 (*) <117 mg/dL  BASIC METABOLIC PANEL     Status: Abnormal   Collection Time    09/02/12  5:12 AM      Result Value Range   Sodium 132 (*) 135 - 145 mEq/L   Potassium 4.6  3.5 - 5.1 mEq/L   Chloride 99  96 - 112 mEq/L   CO2 <7 (*) 19 - 32 mEq/L   Comment: CRITICAL RESULT CALLED TO, READ BACK BY AND VERIFIED WITH:     WILSON,A AT 6:25AM ON 09/02/12 BY FESTERMAN,C   Glucose, Bld 340 (*) 70 - 99 mg/dL   BUN 10  6 - 23 mg/dL   Creatinine, Ser 0.96 (*) 0.50 - 1.10 mg/dL   Calcium 8.9  8.4 - 04.5 mg/dL   GFR calc non Af Amer >90  >90 mL/min   GFR calc Af Amer >90  >90 mL/min   Comment:            The eGFR has been calculated     using the CKD EPI equation.     This calculation has not been     validated in all clinical     situations.     eGFR's persistently     <90 mL/min signify     possible Chronic Kidney Disease.  CBC     Status: Abnormal   Collection Time    09/02/12  5:13 AM      Result Value Range   WBC 18.2 (*) 4.0 - 10.5 K/uL   RBC 4.61  3.87 - 5.11 MIL/uL   Hemoglobin 12.3  12.0 - 15.0 g/dL   HCT 40.9  81.1 - 91.4 %   MCV 81.1  78.0 - 100.0 fL   MCH 26.7  26.0 - 34.0 pg   MCHC 32.9  30.0 - 36.0 g/dL   RDW 78.2  95.6 - 21.3 %   Platelets 311  150 - 400 K/uL  GLUCOSE, CAPILLARY     Status: Abnormal  Collection Time    09/02/12  5:15 AM      Result Value Range    Glucose-Capillary 327 (*) 70 - 99 mg/dL  BASIC METABOLIC PANEL     Status: Abnormal   Collection Time    09/02/12  6:39 AM      Result Value Range   Sodium 127 (*) 135 - 145 mEq/L   Potassium 4.2  3.5 - 5.1 mEq/L   Chloride 96  96 - 112 mEq/L   CO2 7 (*) 19 - 32 mEq/L   Comment: CRITICAL RESULT CALLED TO, READ BACK BY AND VERIFIED WITH:     WILSON,A AT 7:15AM ON 09/02/12 BY FESTERMAN,C   Glucose, Bld 372 (*) 70 - 99 mg/dL   BUN 9  6 - 23 mg/dL   Creatinine, Ser 7.82 (*) 0.50 - 1.10 mg/dL   Calcium 8.6  8.4 - 95.6 mg/dL   GFR calc non Af Amer >90  >90 mL/min   GFR calc Af Amer >90  >90 mL/min   Comment:            The eGFR has been calculated     using the CKD EPI equation.     This calculation has not been     validated in all clinical     situations.     eGFR's persistently     <90 mL/min signify     possible Chronic Kidney Disease.  CBC     Status: Abnormal   Collection Time    09/02/12  6:39 AM      Result Value Range   WBC 17.5 (*) 4.0 - 10.5 K/uL   RBC 4.37  3.87 - 5.11 MIL/uL   Hemoglobin 11.9 (*) 12.0 - 15.0 g/dL   HCT 21.3 (*) 08.6 - 57.8 %   MCV 81.0  78.0 - 100.0 fL   MCH 27.2  26.0 - 34.0 pg   MCHC 33.6  30.0 - 36.0 g/dL   RDW 46.9  62.9 - 52.8 %   Platelets 282  150 - 400 K/uL  MRSA PCR SCREENING     Status: None   Collection Time    09/02/12  8:38 AM      Result Value Range   MRSA by PCR NEGATIVE  NEGATIVE   Comment:            The GeneXpert MRSA Assay (FDA     approved for NASAL specimens     only), is one component of a     comprehensive MRSA colonization     surveillance program. It is not     intended to diagnose MRSA     infection nor to guide or     monitor treatment for     MRSA infections.  GLUCOSE, CAPILLARY     Status: Abnormal   Collection Time    09/02/12  9:29 AM      Result Value Range   Glucose-Capillary 309 (*) 70 - 99 mg/dL   Comment 1 Documented in Chart     Comment 2 Notify RN    GLUCOSE, CAPILLARY     Status: Abnormal    Collection Time    09/02/12  1:27 PM      Result Value Range   Glucose-Capillary 235 (*) 70 - 99 mg/dL  BASIC METABOLIC PANEL     Status: Abnormal   Collection Time    09/02/12  1:31 PM      Result Value Range   Sodium 135  135 - 145 mEq/L   Comment: DELTA CHECK NOTED   Potassium 3.7  3.5 - 5.1 mEq/L   Chloride 103  96 - 112 mEq/L   CO2 14 (*) 19 - 32 mEq/L   Glucose, Bld 217 (*) 70 - 99 mg/dL   BUN 8  6 - 23 mg/dL   Creatinine, Ser 1.61  0.50 - 1.10 mg/dL   Calcium 9.3  8.4 - 09.6 mg/dL   GFR calc non Af Amer >90  >90 mL/min   GFR calc Af Amer >90  >90 mL/min   Comment:            The eGFR has been calculated     using the CKD EPI equation.     This calculation has not been     validated in all clinical     situations.     eGFR's persistently     <90 mL/min signify     possible Chronic Kidney Disease.  GLUCOSE, CAPILLARY     Status: Abnormal   Collection Time    09/02/12  2:32 PM      Result Value Range   Glucose-Capillary 160 (*) 70 - 99 mg/dL  GLUCOSE, CAPILLARY     Status: Abnormal   Collection Time    09/02/12  4:49 PM      Result Value Range   Glucose-Capillary 134 (*) 70 - 99 mg/dL  BASIC METABOLIC PANEL     Status: Abnormal   Collection Time    09/02/12  4:57 PM      Result Value Range   Sodium 134 (*) 135 - 145 mEq/L   Potassium 3.4 (*) 3.5 - 5.1 mEq/L   Chloride 103  96 - 112 mEq/L   CO2 15 (*) 19 - 32 mEq/L   Glucose, Bld 139 (*) 70 - 99 mg/dL   BUN 10  6 - 23 mg/dL   Creatinine, Ser 0.45  0.50 - 1.10 mg/dL   Calcium 9.2  8.4 - 40.9 mg/dL   GFR calc non Af Amer >90  >90 mL/min   GFR calc Af Amer >90  >90 mL/min   Comment:            The eGFR has been calculated     using the CKD EPI equation.     This calculation has not been     validated in all clinical     situations.     eGFR's persistently     <90 mL/min signify     possible Chronic Kidney Disease.  GLUCOSE, CAPILLARY     Status: Abnormal   Collection Time    09/02/12  5:58 PM       Result Value Range   Glucose-Capillary 174 (*) 70 - 99 mg/dL  GLUCOSE, CAPILLARY     Status: Abnormal   Collection Time    09/02/12  7:22 PM      Result Value Range   Glucose-Capillary 258 (*) 70 - 99 mg/dL  GLUCOSE, CAPILLARY     Status: Abnormal   Collection Time    09/02/12  9:04 PM      Result Value Range   Glucose-Capillary 129 (*) 70 - 99 mg/dL  GLUCOSE, CAPILLARY     Status: None   Collection Time    09/02/12 10:08 PM      Result Value Range   Glucose-Capillary 84  70 - 99 mg/dL  GLUCOSE, CAPILLARY     Status: Abnormal   Collection Time  09/02/12 11:15 PM      Result Value Range   Glucose-Capillary 146 (*) 70 - 99 mg/dL  GLUCOSE, CAPILLARY     Status: Abnormal   Collection Time    09/02/12 11:41 PM      Result Value Range   Glucose-Capillary 168 (*) 70 - 99 mg/dL  GLUCOSE, CAPILLARY     Status: Abnormal   Collection Time    09/03/12 12:31 AM      Result Value Range   Glucose-Capillary 243 (*) 70 - 99 mg/dL  GLUCOSE, CAPILLARY     Status: Abnormal   Collection Time    09/03/12  1:38 AM      Result Value Range   Glucose-Capillary 231 (*) 70 - 99 mg/dL  GLUCOSE, CAPILLARY     Status: Abnormal   Collection Time    09/03/12  2:26 AM      Result Value Range   Glucose-Capillary 233 (*) 70 - 99 mg/dL  GLUCOSE, CAPILLARY     Status: Abnormal   Collection Time    09/03/12  3:11 AM      Result Value Range   Glucose-Capillary 187 (*) 70 - 99 mg/dL  GLUCOSE, CAPILLARY     Status: Abnormal   Collection Time    09/03/12  4:17 AM      Result Value Range   Glucose-Capillary 126 (*) 70 - 99 mg/dL  BASIC METABOLIC PANEL     Status: Abnormal   Collection Time    09/03/12  5:07 AM      Result Value Range   Sodium 137  135 - 145 mEq/L   Potassium 3.0 (*) 3.5 - 5.1 mEq/L   Chloride 108  96 - 112 mEq/L   CO2 17 (*) 19 - 32 mEq/L   Glucose, Bld 87  70 - 99 mg/dL   BUN 9  6 - 23 mg/dL   Creatinine, Ser 1.47  0.50 - 1.10 mg/dL   Calcium 9.3  8.4 - 82.9 mg/dL   GFR calc  non Af Amer >90  >90 mL/min   GFR calc Af Amer >90  >90 mL/min   Comment:            The eGFR has been calculated     using the CKD EPI equation.     This calculation has not been     validated in all clinical     situations.     eGFR's persistently     <90 mL/min signify     possible Chronic Kidney Disease.  CBC     Status: Abnormal   Collection Time    09/03/12  5:07 AM      Result Value Range   WBC 12.7 (*) 4.0 - 10.5 K/uL   RBC 4.39  3.87 - 5.11 MIL/uL   Hemoglobin 11.7 (*) 12.0 - 15.0 g/dL   HCT 56.2 (*) 13.0 - 86.5 %   MCV 79.0  78.0 - 100.0 fL   MCH 26.7  26.0 - 34.0 pg   MCHC 33.7  30.0 - 36.0 g/dL   RDW 78.4  69.6 - 29.5 %   Platelets 325  150 - 400 K/uL  GLUCOSE, CAPILLARY     Status: None   Collection Time    09/03/12  5:18 AM      Result Value Range   Glucose-Capillary 78  70 - 99 mg/dL  GLUCOSE, CAPILLARY     Status: Abnormal   Collection Time    09/03/12  6:16  AM      Result Value Range   Glucose-Capillary 109 (*) 70 - 99 mg/dL  GLUCOSE, CAPILLARY     Status: Abnormal   Collection Time    09/03/12  7:29 AM      Result Value Range   Glucose-Capillary 136 (*) 70 - 99 mg/dL   Comment 1 Documented in Chart     Comment 2 Notify RN      No results found.  Review of Systems  Constitutional: Positive for fever, chills and malaise/fatigue.  HENT: Negative.   Eyes: Negative.   Respiratory: Negative.   Cardiovascular: Negative.   Gastrointestinal: Negative.   Genitourinary: Negative.   Musculoskeletal: Negative.   Skin:       Pain and swelling on right back  Neurological: Negative.   Endo/Heme/Allergies: Negative.   Psychiatric/Behavioral: Negative.    Blood pressure 92/57, pulse 117, temperature 98.5 F (36.9 C), temperature source Oral, resp. rate 13, height 5\' 8"  (1.727 m), weight 72.7 kg (160 lb 4.4 oz), last menstrual period 08/12/2012, SpO2 99.00%. Physical Exam  Constitutional: She is oriented to person, place, and time. She appears  well-developed and well-nourished. No distress.  HENT:  Head: Normocephalic and atraumatic.  Eyes: Conjunctivae and EOM are normal. Pupils are equal, round, and reactive to light. No scleral icterus.  Neck: Normal range of motion. No tracheal deviation present. No thyromegaly present.  Cardiovascular: Normal rate and regular rhythm.   Respiratory: Effort normal and breath sounds normal.  GI: Soft. Bowel sounds are normal.  Lymphadenopathy:    She has no cervical adenopathy.  Neurological: She is alert and oriented to person, place, and time.  Skin: Skin is warm and dry.       Assessment/Plan: Right upper back abscess. At this point I have removed the packing. The skin edges were widely open I do not feel that additional packing is required at this time. I also do not appreciate any additional areas of concern of undrained abscess. I would continue at this time with warm heat to the area. Continue IV antibiotic coverage for least the next 24 hours. Twice a day and as needed dressing changes to the area. No additional surgical intervention is required and I will follow peripherally with her progression.  Octavious Zidek C 09/03/2012, 10:07 AM

## 2012-09-03 NOTE — Progress Notes (Signed)
[facility]                                                                                Patient Demographics  Kellie Pearson, is a 47 y.o. female, DOB - 08-Jul-1965, JYN:[id number], QMV:[id number]  Admit date - 09/01/2012  Admitting Physician Edsel Petrin, DO  Outpatient Primary MD for the patient is No PCP Per Patient  LOS - 2   Chief Complaint  Patient presents with  . Abscess        Assessment & Plan    1. SIRs with right posterior rib cage skin abscess. Abscess was drained in the ER, she has been placed on IV vancomycin along with Zosyn as she is diabetic, follow cultures results and clinically, we'll request general surgery to evaluate the patient as the abscess site initially was quite large.   2. Diabetes mellitus type 2 patient in DKA. Gap has closed, she has received insulin and diabetic education, have adjusted Lantus dose, continue sliding scale and stop IV insulin drip.  Lab Results  Component Value Date   HGBA1C 13.8* 09/02/2012    CBG (last 3)   Recent Labs  09/03/12 0518 09/03/12 0616 09/03/12 0729  GLUCAP 78 109* 136*      3. Hyponatremia due to #2 above, has resolved after IV fluids.    4. Low potassium has been replaced.     Code Status: Full  Family Communication: Patient  Disposition Plan: Home   Procedures right posterior rib cage abscess incision and drainage in the ER   Consults  surgery   DVT Prophylaxis    Heparin   Lab Results  Component Value Date   PLT 325 09/03/2012    Medications  Scheduled Meds: . heparin subcutaneous  5,000 Units Subcutaneous Q8H  . insulin aspart  0-15 Units Subcutaneous TID WC  . insulin aspart  0-5 Units Subcutaneous QHS  . insulin glargine  15 Units Subcutaneous Daily  . piperacillin-tazobactam (ZOSYN)  IV  3.375 g Intravenous Q8H  . senna  1 tablet Oral BID  . vancomycin  750 mg Intravenous Q8H   Continuous Infusions:   PRN Meds:.alum & mag hydroxide-simeth, dextrose,  morphine injection, ondansetron (ZOFRAN) IV, ondansetron, oxyCODONE, zolpidem  Antibiotics    Anti-infectives   Start     Dose/Rate Route Frequency Ordered Stop   09/02/12 1300  vancomycin (VANCOCIN) IVPB 750 mg/150 ml premix     750 mg 150 mL/hr over 60 Minutes Intravenous Every 8 hours 09/02/12 0906     09/02/12 1000  piperacillin-tazobactam (ZOSYN) IVPB 3.375 g     3.375 g 12.5 mL/hr over 240 Minutes Intravenous Every 8 hours 09/02/12 0906     09/02/12 0600  vancomycin (VANCOCIN) IVPB 1000 mg/200 mL premix     1,000 mg 200 mL/hr over 60 Minutes Intravenous  Once 09/02/12 0333 09/02/12 0632   09/01/12 1845  vancomycin (VANCOCIN) IVPB 1000 mg/200 mL premix     1,000 mg 200 mL/hr over 60 Minutes Intravenous  Once 09/01/12 1838 09/01/12 2147       Time Spent in minutes   35   SINGH,PRASHANT K M.D on 09/03/2012 at 10:05 AM  Between 7am to  7pm - Pager - [phone]  After 7pm go to [email or web address] - password [email or web address]  And look for the night coverage person covering for me after hours  [facility] Office  [phone]    Subjective:   Kellie Pearson today has, No headache, No chest pain, No abdominal pain - No Nausea, No new weakness tingling or numbness, No Cough - SOB.    Objective:   Filed Vitals:   09/03/12 0400 09/03/12 0436 09/03/12 0500 09/03/12 0846  BP: 98/57  92/57   Pulse:      Temp:  98.5 F (36.9 C)  98.5 F (36.9 C)  TempSrc:    Oral  Resp: 17  13   Height:      Weight:   72.7 kg (160 lb 4.4 oz)   SpO2:        Wt Readings from Last 3 Encounters:  09/03/12 72.7 kg (160 lb 4.4 oz)     Intake/Output Summary (Last 24 hours) at 09/03/12 1005 Last data filed at 09/03/12 0923  Gross per 24 hour  Intake 2562.56 ml  Output      0 ml  Net 2562.56 ml    Exam Awake Alert, Oriented X 3, No new F.N deficits, Normal affect [place].AT,PERRAL Supple Neck,No JVD, No cervical lymphadenopathy appriciated.  Symmetrical Chest wall movement, Good air  movement bilaterally, CTAB RRR,No Gallops,Rubs or new Murmurs, No Parasternal Heave +ve B.Sounds, Abd Soft, Non tender, No organomegaly appriciated, No rebound - guarding or rigidity. No Cyanosis, Clubbing or edema, No new Rash or bruise , right posterior rib cage abscess site appears stable under bandage. Mild surrounding cellulitis.   Data Review   Micro Results Recent Results (from the past 240 hour(s))  MRSA PCR SCREENING     Status: None   Collection Time    09/02/12  8:38 AM      Result Value Range Status   MRSA by PCR NEGATIVE  NEGATIVE Final   Comment:            The GeneXpert MRSA Assay (FDA     approved for NASAL specimens     only), is one component of a     comprehensive MRSA colonization     surveillance program. It is not     intended to diagnose MRSA     infection nor to guide or     monitor treatment for     MRSA infections.    Radiology Reports No results found.  CBC  Recent Labs Lab 09/01/12 1850 09/02/12 0513 09/02/12 0639 09/03/12 0507  WBC 16.7* 18.2* 17.5* 12.7*  HGB 13.7 12.3 11.9* 11.7*  HCT 41.0 37.4 35.4* 34.7*  PLT 317 311 282 325  MCV 80.9 81.1 81.0 79.0  MCH 27.0 26.7 27.2 26.7  MCHC 33.4 32.9 33.6 33.7  RDW 13.0 13.3 13.1 13.1  LYMPHSABS 1.8  --   --   --   MONOABS 1.1*  --   --   --   EOSABS 0.0  --   --   --   BASOSABS 0.0  --   --   --     Chemistries   Recent Labs Lab 09/01/12 1850  09/02/12 0512 09/02/12 0639 09/02/12 1331 09/02/12 1657 09/03/12 0507  NA 127*  < > 132* 127* 135 134* 137  K 4.3  < > 4.6 4.2 3.7 3.4* 3.0*  CL 88*  < > 99 96 103 103 108  CO2 11*  < > <7* 7* 14* 15*  17*  GLUCOSE 394*  < > 340* 372* 217* 139* 87  BUN 14  < > 10 9 8 10 9   CREATININE 0.61  < > 0.48* 0.49* 0.58 0.60 0.50  CALCIUM 9.7  < > 8.9 8.6 9.3 9.2 9.3  AST 12  --   --   --   --   --   --   ALT 11  --   --   --   --   --   --   ALKPHOS 131*  --   --   --   --   --   --   BILITOT 0.4  --   --   --   --   --   --   < > = values  in this interval not displayed. ------------------------------------------------------------------------------------------------------------------ estimated creatinine clearance is 89.6 ml/min (by C-G formula based on Cr of 0.5). ------------------------------------------------------------------------------------------------------------------  Recent Labs  09/02/12 0305  HGBA1C 13.8*   ------------------------------------------------------------------------------------------------------------------ No results found for this basename: CHOL, HDL, LDLCALC, TRIG, CHOLHDL, LDLDIRECT,  in the last 72 hours ------------------------------------------------------------------------------------------------------------------  Recent Labs  09/02/12 0305  TSH 1.085   ------------------------------------------------------------------------------------------------------------------ No results found for this basename: VITAMINB12, FOLATE, FERRITIN, TIBC, IRON, RETICCTPCT,  in the last 72 hours  Coagulation profile No results found for this basename: INR, PROTIME,  in the last 168 hours  No results found for this basename: DDIMER,  in the last 72 hours  Cardiac Enzymes No results found for this basename: CK, CKMB, TROPONINI, MYOGLOBIN,  in the last 168 hours ------------------------------------------------------------------------------------------------------------------ No components found with this basename: POCBNP,

## 2012-09-03 NOTE — Progress Notes (Signed)
Inpatient Diabetes Program Recommendations  AACE/ADA: New Consensus Statement on Inpatient Glycemic Control (2013)  Target Ranges:  Prepandial:   less than 140 mg/dL      Peak postprandial:   less than 180 mg/dL (1-2 hours)      Critically ill patients:  140 - 180 mg/dL   Results for WANETTE, ROBISON (MRN [id number]) as of 09/03/2012 08:15  Ref. Range 09/03/2012 03:11 09/03/2012 04:17 09/03/2012 05:18 09/03/2012 06:16 09/03/2012 07:29  Glucose-Capillary Latest Range: 70-99 mg/dL 409 (H) 811 (H) 78 914 (H) 136 (H)    Inpatient Diabetes Program Recommendations Insulin - Basal: Please consider increasing Lantus to 20 units daily.  Note: Patient was placed on an insulin drip on 5/13 due to DKA and has orders to transition to SQ insulin.  According to BMET results from 5/14 @ 5:07 am anion gap is 12 (137 - [108+17]).  Patient was given Lantus 10 units at 7:52am and Novolog 2 units correction at 7:54am. Over the last 24 hours patient required 76 units of Regular insulin via insulin drip to maintain blood glucose in target ranges on GlucoStabilizer.  Please consider increasing Lantus dose to at least 20 units daily and continue to adjust accordingly to improve glycemic control.  Noted that A1C obtained on 09/02/12 is 13.8%.  May want to consider consulting Dr. Fransico Him for diabetes management and to establish relationship with patient.    Thanks, Orlando Penner, RN, BSN, CCRN Diabetes Coordinator Inpatient Diabetes Program [phone]

## 2012-09-04 DIAGNOSIS — E119 Type 2 diabetes mellitus without complications: Secondary | ICD-10-CM | POA: Diagnosis not present

## 2012-09-04 DIAGNOSIS — E1110 Type 2 diabetes mellitus with ketoacidosis without coma: Secondary | ICD-10-CM | POA: Diagnosis not present

## 2012-09-04 DIAGNOSIS — E872 Acidosis: Secondary | ICD-10-CM | POA: Diagnosis not present

## 2012-09-04 DIAGNOSIS — L0390 Cellulitis, unspecified: Secondary | ICD-10-CM | POA: Diagnosis not present

## 2012-09-04 LAB — BASIC METABOLIC PANEL
CO2: 18 mEq/L — ABNORMAL LOW (ref 19–32)
Calcium: 8.4 mg/dL (ref 8.4–10.5)
Creatinine, Ser: 0.49 mg/dL — ABNORMAL LOW (ref 0.50–1.10)
GFR calc non Af Amer: >90 mL/min (ref >90)
Sodium: 139 mEq/L (ref 135–145)

## 2012-09-04 LAB — WOUND CULTURE

## 2012-09-04 MED ORDER — INSULIN GLARGINE 100 UNIT/ML ~~LOC~~ SOLN
45.0000 [IU] | Freq: Every day | SUBCUTANEOUS | Status: DC
  Administered 2012-09-04: 45 [IU] via SUBCUTANEOUS
  Filled 2012-09-04 (×2): qty 0.45

## 2012-09-04 MED ORDER — INSULIN ASPART 100 UNIT/ML ~~LOC~~ SOLN: 0.0000 [IU] | Freq: Three times a day (TID) | SUBCUTANEOUS | Status: DC

## 2012-09-04 MED ORDER — FREESTYLE SYSTEM KIT: 1.0000 | PACK | Freq: Three times a day (TID) | Status: DC

## 2012-09-04 MED ORDER — INSULIN NPH ISOPHANE & REGULAR (70-30) 100 UNIT/ML ~~LOC~~ SUSP: 20.0000 [IU] | Freq: Two times a day (BID) | SUBCUTANEOUS | Status: DC

## 2012-09-04 MED ORDER — SULFAMETHOXAZOLE-TMP DS 800-160 MG PO TABS: 1.0000 | ORAL_TABLET | Freq: Two times a day (BID) | ORAL | Status: AC

## 2012-09-04 NOTE — Care Management Note (Addendum)
    Page 1 of 2   09/04/2012     1:38:59 PM   CARE MANAGEMENT NOTE 09/04/2012  Patient:  Kellie Pearson, Kellie Pearson   Account Number:  [id number]  Date Initiated:  09/03/2012  Documentation initiated by:  Anibal Henderson  Subjective/Objective Assessment:   pt admitted with abscess on back and DKA. Pt is from home, is independent and will return home at D/C.     Action/Plan:   HH ordered for wound care, but pt refuses. She states she has a nephew who did the wound care previously, when she had an abscess on her back, and insist on standing up out of bed to show me the scar from the old wound, in case I did not   Anticipated DC Date:  09/04/2012   Anticipated DC Plan:  HOME/SELF CARE      DC Planning Services  CM consult      [facility] Choice  HOME HEALTH   Choice offered to / List presented to:  C-1 Patient        HH arranged  HH-1 RN      [facility] agency  [facility]   Status of service:  Completed, signed off Medicare Important Message given?  YES (If response is "NO", the following Medicare IM given date fields will be blank) Date Medicare IM given:  09/04/2012 Date Additional Medicare IM given:    Discharge Disposition:  HOME/SELF CARE  Per UR Regulation:    If discussed at Long Length of Stay Meetings, dates discussed:    Comments:  09/04/12 1330 Arlyss Queen, RN BSN CM Pts mother arrived to pick pt up and she has insisted that pt be set up with [facility] for [facility] to follow back abcess. Alroy Bailiff of [facility] is aware and will collect the pts information from the chart. Pts mother has arranged follow up with Dr. Tanya Nones of [facility].  09/04/12 1123 Arlyss Queen, RN BSN CM Pt discharged home today. CM offered HH but pt refuses and states that her nephew will do the wound care for her like he has done in the past. Pt also stated that she does not want CM to arrange follow up with previous PCP or any new PCP at this time. CM explained to pt that CM unable to help with  medications due to pt having insurance. CM did offer other options for medication assistance ;ie: [email or web address], following up with her PCP in [place] who will give her samples from his office, and asked if pt has family that would help her get medications. With each attempt, pt had an excuse as to why she could not use those options to help her. No other CM needs noted.  09/03/12 1330 Anibal Henderson Believe her!. Pt states she spoke with diabetic coordinator and dietician, and does not need anything else. She will just go home with nephew caring for wound, and does not need any other help with wound or diabetes

## 2012-09-04 NOTE — Plan of Care (Signed)
Problem: Discharge Progression Outcomes Goal: Obtain signed CBG meter Rx form Outcome: Adequate for Discharge Pt given prescription for meter Goal: Barriers To Progression Addressed/Resolved Outcome: Adequate for Discharge Diabetic coordinator met with pt and discussed diabetes management Goal: Pt. states knowledge of home DM medications Outcome: Adequate for Discharge Pt and pt parents  given extensive instruction on insulin administration and blood sugar management, pt and parents repeated instructions and verbalized understanding of instructions.

## 2012-09-04 NOTE — Discharge Summary (Addendum)
[facility]                                                                                   Kellie Pearson, is a 47 y.o. female  DOB Apr 13, 1966  MRN [id number].  Admission date:  09/01/2012  Discharge Date:  09/04/2012  Primary MD  No PCP Per Patient  Admitting Physician  Edsel Petrin, DO  Admission Diagnosis  Abscess [682.9] Diabetes [250.00]  Discharge Diagnosis     Active Problems:   Metabolic acidosis   Tachycardia   Skin abscess   Increased anion gap metabolic acidosis   DKA (diabetic ketoacidoses)    Past Medical History  Diagnosis Date  . Diabetes mellitus without complication     History reviewed. No pertinent past surgical history.   Recommendations for primary care physician for things to follow:   Follow BMP, Accu-Cheks and left posterior rib cage wound closely   Discharge Diagnoses:   Active Problems:   Metabolic acidosis   Tachycardia   Skin abscess   Increased anion gap metabolic acidosis   DKA (diabetic ketoacidoses)    Discharge Condition: Stable   Diet recommendation: See Discharge Instructions below   Consults Gen. surgery, diabetes educator, case manager    History of present illness and  Hospital Course:     Kindly see H&P for history of present illness and admission details, please review complete Labs, Consult reports and Test reports for all details in brief Kellie Pearson, is a 47 y.o. female, patient with history of diabetes mellitus type 2 who took herself off of insulin for unclear reasons, history of medication noncompliance was admitted to the hospital for left posterior rib cage abscess which was incised and drained in the ER, she was also in DKA, she was admitted to the ICU on insulin drip along with IV antibiotics, wound culture so far negative, with IV antibiotics her abscess site has defervesced, there is no signs of fluctuance or pus remaining at that site. Minimal to no cellulitis at this time, she was  seen by general surgery and cleared for outpatient followup with them with daily wound care, case management has been consulted to arrange for home health nursing for the same along with helping finding patient her primary care physician. He will be discharged on 6 more days of Bactrim.   With IV insulin and IV fluids her DKA resolved, she has been counseled on insulin compliance, case management has been requested to assist her with her medication needs, he will be placed on NovoLog 70-30 twice a day along with sliding scale short-acting NovoLog, glucometer and testing supplies will be provided.    Addendum. I was informed by the case management staff the patient has refused home nursing, setting up appointment with suggested PCP. I doubt that she is going to be compliant, despite my counseling and warning of adverse effects including worsening infection, death and disability she continues to not follow a recommendations.     Today   Subjective:   Kellie Pearson today has no headache,no chest abdominal pain,no new weakness tingling or numbness, feels much better wants to go home today.    Objective:  Blood pressure 106/65, pulse 82, temperature 97.6 F (36.4 C), temperature source Oral, resp. rate 18, height 5\' 8"  (1.727 m), weight 72.7 kg (160 lb 4.4 oz), last menstrual period 08/12/2012, SpO2 100.00%.   Intake/Output Summary (Last 24 hours) at 09/04/12 0814 Last data filed at 09/04/12 0600  Gross per 24 hour  Intake   1760 ml  Output      0 ml  Net   1760 ml    Exam Awake Alert, Oriented *3, No new F.N deficits, Normal affect .AT,PERRAL Supple Neck,No JVD, No cervical lymphadenopathy appriciated.  Symmetrical Chest wall movement, Good air movement bilaterally, CTAB, left posterior rib cage abscess I&D site appears clean, minimal surrounding cellulitis RRR,No Gallops,Rubs or new Murmurs, No Parasternal Heave +ve B.Sounds, Abd Soft, Non tender, No organomegaly appriciated, No  rebound -guarding or rigidity. No Cyanosis, Clubbing or edema, No new Rash or bruise  Data Review   Major procedures and Radiology Reports - PLEASE review detailed and final reports for all details in brief -    I&D of left posterior rib cage abscess in the ER   No results found.  Micro Results      Recent Results (from the past 240 hour(s))  MRSA PCR SCREENING     Status: None   Collection Time    09/02/12  8:38 AM      Result Value Range Status   MRSA by PCR NEGATIVE  NEGATIVE Final   Comment:            The GeneXpert MRSA Assay (FDA     approved for NASAL specimens     only), is one component of a     comprehensive MRSA colonization     surveillance program. It is not     intended to diagnose MRSA     infection nor to guide or     monitor treatment for     MRSA infections.     CBC w Diff: Lab Results  Component Value Date   WBC 12.7* 09/03/2012   HGB 11.7* 09/03/2012   HCT 34.7* 09/03/2012   PLT 325 09/03/2012   LYMPHOPCT 11* 09/01/2012   MONOPCT 7 09/01/2012   EOSPCT 0 09/01/2012   BASOPCT 0 09/01/2012    CMP: Lab Results  Component Value Date   NA 139 09/04/2012   K 3.5 09/04/2012   CL 109 09/04/2012   CO2 18* 09/04/2012   BUN 10 09/04/2012   CREATININE 0.49* 09/04/2012   PROT 9.0* 09/01/2012   ALBUMIN 3.7 09/01/2012   BILITOT 0.4 09/01/2012   ALKPHOS 131* 09/01/2012   AST 12 09/01/2012   ALT 11 09/01/2012  .   Discharge Instructions     Follow with Primary MD as suggested by the case manager in 3 days   Get CBC, CMP, checked 3 days by Primary MD and again as instructed by your Primary MD.   Accuchecks 4 times/day, Once in AM empty stomach and then before each meal. Log in all results and show them to your Prim.MD in 3 days. If any glucose reading is under 80 or above 300 call your Prim MD immidiately. Follow Low glucose instructions for glucose under 80 as instructed.  Keep your wound clean and dry at all times.  Get Medicines reviewed and  adjusted.  Please request your Prim.MD to go over all Hospital Tests and Procedure/Radiological results at the follow up, please get all Hospital records sent to your Prim MD by signing hospital release before you  go home.  Activity: As tolerated with Full fall precautions use walker/cane & assistance as needed   Diet:  Heart healthy low carbohydrate  For Heart failure patients - Check your Weight same time everyday, if you gain over 2 pounds, or you develop in leg swelling, experience more shortness of breath or chest pain, call your Primary MD immediately. Follow Cardiac Low Salt Diet and 1.8 lit/day fluid restriction.  Disposition Home   If you experience worsening of your admission symptoms, develop shortness of breath, life threatening emergency, suicidal or homicidal thoughts you must seek medical attention immediately by calling 911 or calling your MD immediately  if symptoms less severe.  You Must read complete instructions/literature along with all the possible adverse reactions/side effects for all the Medicines you take and that have been prescribed to you. Take any new Medicines after you have completely understood and accpet all the possible adverse reactions/side effects.   Do not drive and provide baby sitting services if your were admitted for syncope or siezures until you have seen by Primary MD or a Neurologist and advised to do so again.  Do not drive when taking Pain medications.    Do not take more than prescribed Pain, Sleep and Anxiety Medications  Special Instructions: If you have smoked or chewed Tobacco  in the last 2 yrs please stop smoking, stop any regular Alcohol  and or any Recreational drug use.  Wear Seat belts while driving.   Follow-up Information   Follow up with Fabio Bering, MD. Schedule an appointment as soon as possible for a visit in 3 days.   Contact information:   [address] [place] [address] [phone]       Follow  up with PCP as suggested by the case manager. Schedule an appointment as soon as possible for a visit in 3 days.        Discharge Medications     Medication List    TAKE these medications       glucose monitoring kit monitoring kit  1 each by Does not apply route 4 (four) times daily - after meals and at bedtime. 1 month Diabetic Testing Supplies for QAC-QHS accuchecks.     insulin aspart 100 UNIT/ML injection  Commonly known as:  novoLOG  Inject 0-15 Units into the skin 3 (three) times daily with meals. Before each meal 3 times a day, 140-199 - 2 units, 200-250 - 4 units, 251-299 - 6 units,  300-349 - 8 units,  350 or above 10 units. Insulin PEN if approved, provide syringes and needles if needed.     insulin NPH-regular (70-30) 100 UNIT/ML injection  Commonly known as:  NOVOLIN 70/30  Inject 20 Units into the skin 2 (two) times daily with a meal.     sulfamethoxazole-trimethoprim 800-160 MG per tablet  Commonly known as:  BACTRIM DS  Take 1 tablet by mouth 2 (two) times daily. For 2-3 days           Total Time in preparing paper work, data evaluation and todays exam - 35 minutes  Leroy Sea M.D on 09/04/2012 at 8:14 AM  [facility] Office  [phone]

## 2012-09-05 DIAGNOSIS — L02219 Cutaneous abscess of trunk, unspecified: Secondary | ICD-10-CM | POA: Diagnosis not present

## 2012-09-05 DIAGNOSIS — L03319 Cellulitis of trunk, unspecified: Secondary | ICD-10-CM | POA: Diagnosis not present

## 2012-09-05 DIAGNOSIS — Z794 Long term (current) use of insulin: Secondary | ICD-10-CM | POA: Diagnosis not present

## 2012-09-05 DIAGNOSIS — E119 Type 2 diabetes mellitus without complications: Secondary | ICD-10-CM | POA: Diagnosis not present

## 2012-09-08 DIAGNOSIS — E119 Type 2 diabetes mellitus without complications: Secondary | ICD-10-CM | POA: Diagnosis not present

## 2012-09-08 DIAGNOSIS — Z794 Long term (current) use of insulin: Secondary | ICD-10-CM | POA: Diagnosis not present

## 2012-09-08 DIAGNOSIS — L03319 Cellulitis of trunk, unspecified: Secondary | ICD-10-CM | POA: Diagnosis not present

## 2012-09-09 DIAGNOSIS — L02219 Cutaneous abscess of trunk, unspecified: Secondary | ICD-10-CM | POA: Diagnosis not present

## 2012-09-09 DIAGNOSIS — L03319 Cellulitis of trunk, unspecified: Secondary | ICD-10-CM | POA: Diagnosis not present

## 2012-09-11 DIAGNOSIS — L03319 Cellulitis of trunk, unspecified: Secondary | ICD-10-CM | POA: Diagnosis not present

## 2012-09-11 DIAGNOSIS — Z794 Long term (current) use of insulin: Secondary | ICD-10-CM | POA: Diagnosis not present

## 2012-09-11 DIAGNOSIS — E119 Type 2 diabetes mellitus without complications: Secondary | ICD-10-CM | POA: Diagnosis not present

## 2012-09-12 ENCOUNTER — Ambulatory Visit (INDEPENDENT_AMBULATORY_CARE_PROVIDER_SITE_OTHER): Payer: Medicare Other | Admitting: Family Medicine

## 2012-09-12 ENCOUNTER — Encounter: Payer: Self-pay | Admitting: Family Medicine

## 2012-09-12 VITALS — BP 110/72 | HR 74 | Temp 98.7°F | Resp 16 | Wt 170.0 lb

## 2012-09-12 DIAGNOSIS — L0291 Cutaneous abscess, unspecified: Secondary | ICD-10-CM | POA: Insufficient documentation

## 2012-09-12 MED ORDER — INSULIN REGULAR HUMAN 100 UNIT/ML IJ SOLN: INTRAMUSCULAR | Status: DC

## 2012-09-12 NOTE — Progress Notes (Signed)
Subjective:    Patient ID: Kellie Pearson, female    DOB: 04-04-1966, 47 y.o.   MRN: [id number]  HPI  Patient is here today to establish care.  She was recently admitted in metabolic acidosis due to diabetic ketoacidosis. This likely stem from an abscess it was on her right middle back. This required surgical I&D. She was treated in hospital with vancomycin and Zosyn. She was sent home on additional 6 days of Bactrim. The abscess on her back is healing nicely ear there is a 1 cm healed I&D site which is no longer open. It isn't longer draining purulent material. Barrier around the site is firm, but not indurated warm or erythematous. There is no fluctuance. It feels like scar tissue. She has no fevers chills or other signs of systemic illness.    She was discharged home on Novolin 70/30. She's taking 20 units twice a day. Fasting blood sugars are 70-110. Her lunchtime sugars are 100-140. Her suppertime sugars are 100-160. However her nighttime sugars prior to bed are 200-250. She is trying to adhere to a low carbohydrate diet. She does complain about burning dysesthesias in her feet. She never got NovoLog due to its cost.  She is taking no regular insulin with meals. Past Medical History  Diagnosis Date  . Diabetes mellitus without complication   . Abscess     admitted 5/14   Current Outpatient Prescriptions on File Prior to Visit  Medication Sig Dispense Refill  . glucose monitoring kit (FREESTYLE) monitoring kit 1 each by Does not apply route 4 (four) times daily - after meals and at bedtime. 1 month Diabetic Testing Supplies for QAC-QHS accuchecks.  1 each  1  . insulin NPH-regular (NOVOLIN 70/30) (70-30) 100 UNIT/ML injection Inject 20 Units into the skin 2 (two) times daily with a meal.  10 mL  12   No current facility-administered medications on file prior to visit.   No Known Allergies History   Social History  . Marital Status: Single    Spouse Name: N/A    Number of Children: N/A   . Years of Education: N/A   Occupational History  . Not on file.   Social History Main Topics  . Smoking status: Never Smoker   . Smokeless tobacco: Not on file  . Alcohol Use: Not on file  . Drug Use: Not on file  . Sexually Active: Not on file   Other Topics Concern  . Not on file   Social History Narrative  . No narrative on file   Father has diabetes. Mother has insulin-dependent diabetes. Both have high blood pressure and high cholesterol.  Review of Systems  All other systems reviewed and are negative.       Objective:   Physical Exam  Vitals reviewed. Constitutional: She appears well-developed and well-nourished.  Cardiovascular: Normal rate, regular rhythm, normal heart sounds and intact distal pulses.   No murmur heard. Pulmonary/Chest: Effort normal and breath sounds normal. No respiratory distress. She has no wheezes. She has no rales.  Abdominal: Soft. Bowel sounds are normal.   there is a 1.5 cm healed I&D site on her right middle back. The skin around it is firm. It is not indurated. It is not erythematous. It is not warm to touch. There is no fluctuance..        Assessment & Plan:  1. Type II or unspecified type diabetes mellitus without mention of complication, uncontrolled Patient seems over wound with a diagnosis of diabetes.  She is unwilling to add NovoLog at mealtime due to the cost of NovoLog. We will try Humulin R 5 units at suppertime. Have asked her to continue to check her sugars 4 times a day and bring that back with her in one week. Once her sugars are under good control, I would recommend having her seen a diabetic nutritionist for extensive meal planning and diabetic education. Furthermore we'll likely need to start her on an ACE inhibitor for renal protection. I wanted to avoid that today due to the recent history of metabolic acidosis. She'll also need an ophthalmology consult. Should also benefit from taking a daily aspirin.  This was not  broached with the patient today do to her obvious stress with the diagnosis of diabetes wanted to take it one step at a time for the patient. Would also need to get fasting lipid panel check her cholesterol. With regard to the abscess appears to be well-healing. Have asked her to finish the antibiotics and she can followup when necessary with her surgeon.

## 2012-09-16 DIAGNOSIS — L02219 Cutaneous abscess of trunk, unspecified: Secondary | ICD-10-CM | POA: Diagnosis not present

## 2012-09-16 DIAGNOSIS — L03319 Cellulitis of trunk, unspecified: Secondary | ICD-10-CM | POA: Diagnosis not present

## 2012-09-18 DIAGNOSIS — L02219 Cutaneous abscess of trunk, unspecified: Secondary | ICD-10-CM | POA: Diagnosis not present

## 2012-09-18 DIAGNOSIS — Z794 Long term (current) use of insulin: Secondary | ICD-10-CM | POA: Diagnosis not present

## 2012-09-18 DIAGNOSIS — E119 Type 2 diabetes mellitus without complications: Secondary | ICD-10-CM | POA: Diagnosis not present

## 2012-09-19 ENCOUNTER — Ambulatory Visit: Payer: Medicare Other | Admitting: Family Medicine

## 2012-09-25 ENCOUNTER — Ambulatory Visit (INDEPENDENT_AMBULATORY_CARE_PROVIDER_SITE_OTHER): Payer: Medicare Other | Admitting: Family Medicine

## 2012-09-25 ENCOUNTER — Encounter: Payer: Self-pay | Admitting: Family Medicine

## 2012-09-25 VITALS — BP 110/80 | HR 82 | Temp 97.8°F | Resp 18 | Ht 67.0 in | Wt 172.0 lb

## 2012-09-25 NOTE — Progress Notes (Signed)
Subjective:    Patient ID: Kellie Pearson, female    DOB: 12/06/1965, 47 y.o.   MRN: [id number]  HPI  09/12/12 Patient is here today to establish care.  She was recently admitted in metabolic acidosis due to diabetic ketoacidosis. This likely stem from an abscess it was on her right middle back. This required surgical I&D. She was treated in hospital with vancomycin and Zosyn. She was sent home on additional 6 days of Bactrim. The abscess on her back is healing nicely ear there is a 1 cm healed I&D site which is no longer open. It isn't longer draining purulent material. Barrier around the site is firm, but not indurated warm or erythematous. There is no fluctuance. It feels like scar tissue. She has no fevers chills or other signs of systemic illness.    She was discharged home on Novolin 70/30. She's taking 20 units twice a day. Fasting blood sugars are 70-110. Her lunchtime sugars are 100-140. Her suppertime sugars are 100-160. However her nighttime sugars prior to bed are 200-250. She is trying to adhere to a low carbohydrate diet. She does complain about burning dysesthesias in her feet. She never got NovoLog due to its cost.  She is taking no regular insulin with meals.  Therefore, i diagnosed her with: 1. Type II or unspecified type diabetes mellitus without mention of complication, uncontrolled Patient seems over wound with a diagnosis of diabetes. She is unwilling to add NovoLog at mealtime due to the cost of NovoLog. We will try Humulin R 5 units at suppertime. Have asked her to continue to check her sugars 4 times a day and bring that back with her in one week. Once her sugars are under good control, I would recommend having her seen a diabetic nutritionist for extensive meal planning and diabetic education. Furthermore we'll likely need to start her on an ACE inhibitor for renal protection. I wanted to avoid that today due to the recent history of metabolic acidosis. She'll also need an  ophthalmology consult. Should also benefit from taking a daily aspirin.  This was not broached with the patient today do to her obvious stress with the diagnosis of diabetes wanted to take it one step at a time for the patient. Would also need to get fasting lipid panel check her cholesterol. With regard to the abscess appears to be well-healing. Have asked her to finish the antibiotics and she can followup when necessary with her surgeon.  09/25/12 Since I saw her, patient had to decrease her insulin substantially. She is currently taking Novolin 7030 10 units twice a day do to hypoglycemic episodes. She is only using humulin R as needed with a sliding scale for elevated blood sugars. Her sugars are ranging 130 to 200. However she had symptomatic hypoglycemia in the 130s.. Past Medical History  Diagnosis Date  . Diabetes mellitus without complication   . Abscess     admitted 5/14   Current Outpatient Prescriptions on File Prior to Visit  Medication Sig Dispense Refill  . glucose monitoring kit (FREESTYLE) monitoring kit 1 each by Does not apply route 4 (four) times daily - after meals and at bedtime. 1 month Diabetic Testing Supplies for QAC-QHS accuchecks.  1 each  1  . insulin NPH-regular (NOVOLIN 70/30) (70-30) 100 UNIT/ML injection Inject 20 Units into the skin 2 (two) times daily with a meal.  10 mL  12  . insulin regular (HUMULIN R) 100 units/mL injection Take 5 units prior to supper  10 mL  12   No current facility-administered medications on file prior to visit.   No Known Allergies History   Social History  . Marital Status: Single    Spouse Name: N/A    Number of Children: N/A  . Years of Education: N/A   Occupational History  . Not on file.   Social History Main Topics  . Smoking status: Never Smoker   . Smokeless tobacco: Not on file  . Alcohol Use: Not on file  . Drug Use: Not on file  . Sexually Active: Not on file   Other Topics Concern  . Not on file   Social  History Narrative  . No narrative on file   Father has diabetes. Mother has insulin-dependent diabetes. Both have high blood pressure and high cholesterol.  Review of Systems  All other systems reviewed and are negative.       Objective:   Physical Exam  Vitals reviewed. Constitutional: She appears well-developed and well-nourished.  Cardiovascular: Normal rate, regular rhythm, normal heart sounds and intact distal pulses.   No murmur heard. Pulmonary/Chest: Effort normal and breath sounds normal. No respiratory distress. She has no wheezes. She has no rales.  Abdominal: Soft. Bowel sounds are normal.          Assessment & Plan:  1. Type II or unspecified type diabetes mellitus without mention of complication, uncontrolled Continue Novolin 70/30 10 units subcutaneous twice a day. Continue using Humulin R sliding scale.  I believe the patient is in a honeymoon phase of her diabetes and is requiring less insulin at this time. Therefore I want to recheck her sugars in a month to see if sugars are rising. In the meantime I want to set her up for diabetic education as well as to see an ophthalmologist. Also recommended she start an aspirin 81 mg by mouth daily. She will still need an ACE inhibitor. However she is very resistant to new medications at the present time. She's also needs her cholesterol checked. - Ambulatory referral to diabetic education - Ambulatory referral to Ophthalmology

## 2012-10-07 DIAGNOSIS — L02219 Cutaneous abscess of trunk, unspecified: Secondary | ICD-10-CM | POA: Diagnosis not present

## 2012-10-07 DIAGNOSIS — Z794 Long term (current) use of insulin: Secondary | ICD-10-CM | POA: Diagnosis not present

## 2012-10-07 DIAGNOSIS — E119 Type 2 diabetes mellitus without complications: Secondary | ICD-10-CM | POA: Diagnosis not present

## 2012-10-14 ENCOUNTER — Ambulatory Visit: Payer: Medicare Other | Admitting: Dietician

## 2012-10-29 DIAGNOSIS — L02219 Cutaneous abscess of trunk, unspecified: Secondary | ICD-10-CM | POA: Diagnosis not present

## 2012-10-29 DIAGNOSIS — E119 Type 2 diabetes mellitus without complications: Secondary | ICD-10-CM | POA: Diagnosis not present

## 2012-10-29 DIAGNOSIS — Z794 Long term (current) use of insulin: Secondary | ICD-10-CM | POA: Diagnosis not present

## 2012-11-20 ENCOUNTER — Encounter: Payer: Medicare Other | Attending: Family Medicine | Admitting: *Deleted

## 2012-11-20 ENCOUNTER — Encounter: Payer: Self-pay | Admitting: *Deleted

## 2012-11-20 DIAGNOSIS — E119 Type 2 diabetes mellitus without complications: Secondary | ICD-10-CM

## 2012-11-20 DIAGNOSIS — Z713 Dietary counseling and surveillance: Secondary | ICD-10-CM | POA: Diagnosis not present

## 2012-11-20 NOTE — Progress Notes (Signed)
Appt start time: 0800 end time:  0900.  Assessment:  Patient was seen on  11/20/2012 for individual diabetes education. States history of diabetes for about 10 years. She is SMBG and taking insulin, states she forgets on occasion. Lives with parents home but she buys her own food and prepares her own foods. States she does walk in the neighborhood twice a day. She brought her food and BG diary with her and discussed appropriate rationale of decisions to reduce insulin doses when needed to reduce risk of hypoglycemia based on previous experiences.   Current HbA1c: 13.8  MEDICATIONS: Novolin 70/30 and Regular @ sliding scale   DIETARY INTAKE:  Usual eating pattern includes 3 meals and 1-2 snacks per day.  Everyday foods include good variety of meat and vegetables, inadequate starch and fruit intake.  Avoided foods include regular soda.    24-hr recall:  B ( AM): 1/2 cup oatmeal and an egg or sausage OR biscuit with meat, water OR diet soda  Snk ( AM): occasionally PNB crackers or 1/2 piece fruit or piece of cheese  L ( PM): meat, vegetable or salad, occasionally pizza, water Snk ( PM): same as AM or 1/2 PNB sandwich if low BG D ( PM): lean meat, vegetables, may include a starchy vegetable or a fruit, water Snk ( PM): occasionally Beverages: water, diet soda  Usual physical activity: walks twice a day  Estimated energy needs: 1400 calories 158 g carbohydrates 105 g protein 39 g fat  Progress Towards Goal(s):  In progress.   Nutritional Diagnosis:  NB-1.1 Food and nutrition-related knowledge deficit As related to diabetes control.  As evidenced by A1c of 13.8%.    Intervention:  Nutrition counseling provided.  Discussed diabetes disease process and treatment options.  Discussed physiology of diabetes and role of obesity on insulin resistance.    Discussed role of medications and diet in glucose control  Provided education on macronutrients on glucose levels.  Not able to provided  education on carb counting, importance of regularly scheduled meals/snacks, and meal planning due to time constraint  Reviewed patient medications.  Discussed role of medication on blood glucose and possible side effects. Explained to patient that Novolin 70/30 was 70% NPH and 30% Regular so she is getting meal time coverage for breakfast and lunch with AM dose and Supper plus night time with evening dose.  Discussed blood glucose monitoring and interpretation.      Handouts given during visit include: Carb Counting and Food Label handouts Meal Plan Card Insulin Action handout  BG Log Sheet  Barriers to learning/adherance to lifestyle change: finances and transportation  Diabetes self-care support plan: encouraged follow up appointments, patient did not commit to follow up.  Monitoring/Evaluation:  Dietary intake, exercise, SMBG, and body weight prn.

## 2013-07-22 ENCOUNTER — Encounter: Payer: Self-pay | Admitting: Family Medicine

## 2013-07-22 ENCOUNTER — Ambulatory Visit (INDEPENDENT_AMBULATORY_CARE_PROVIDER_SITE_OTHER): Payer: Medicare Other | Admitting: Family Medicine

## 2013-07-22 VITALS — BP 130/72 | Temp 98.8°F | Ht 67.0 in | Wt 165.0 lb

## 2013-07-22 DIAGNOSIS — L732 Hidradenitis suppurativa: Secondary | ICD-10-CM | POA: Diagnosis not present

## 2013-07-22 MED ORDER — SULFAMETHOXAZOLE-TMP DS 800-160 MG PO TABS: 1.0000 | ORAL_TABLET | Freq: Two times a day (BID) | ORAL | Status: DC

## 2013-07-22 NOTE — Progress Notes (Signed)
   Subjective:    Patient ID: Kellie Pearson, female    DOB: 05/12/1965, 47 y.o.   MRN: [id number]  HPI  Patient has a history of abscesses. One actually put her in the hospital in DKA the past. Last week she's developed a tender erythematous nodules in her right axilla. Each is approximately 1 cm in size. They're erythematous. They're not fluctuant. There is no apparent fluid underneath the skin that can be drained. It has the characteristic appearance of hidradenitis suppurtiva.  She is overdue for evaluation for her diabetes. She is not taking her sugars regularly. She does state she's been compliant with her insulin. Past Medical History  Diagnosis Date  . Diabetes mellitus without complication   . Abscess     admitted 5/14   Current Outpatient Prescriptions on File Prior to Visit  Medication Sig Dispense Refill  . glucose monitoring kit (FREESTYLE) monitoring kit 1 each by Does not apply route 4 (four) times daily - after meals and at bedtime. 1 month Diabetic Testing Supplies for QAC-QHS accuchecks.  1 each  1  . insulin NPH-regular (NOVOLIN 70/30) (70-30) 100 UNIT/ML injection Inject 20 Units into the skin 2 (two) times daily with a meal.  10 mL  12  . insulin regular (HUMULIN R) 100 units/mL injection Take 5 units prior to supper  10 mL  12   No current facility-administered medications on file prior to visit.   No Known Allergies History   Social History  . Marital Status: Single    Spouse Name: N/A    Number of Children: N/A  . Years of Education: N/A   Occupational History  . Not on file.   Social History Main Topics  . Smoking status: Never Smoker   . Smokeless tobacco: Not on file  . Alcohol Use: Not on file  . Drug Use: Not on file  . Sexual Activity: Not on file   Other Topics Concern  . Not on file   Social History Narrative  . No narrative on file     Review of Systems  All other systems reviewed and are negative.       Objective:   Physical Exam    Vitals reviewed. Cardiovascular: Normal rate, regular rhythm and normal heart sounds.   Pulmonary/Chest: Effort normal and breath sounds normal. No respiratory distress. She has no wheezes. She has no rales.  Skin: Rash noted. There is erythema.   5 subcutaneous nodules in the right axilla. Each is approximately 1-1.4 cm in size. They're erythematous and tender. They are not fluctuant. There is no apparent fluid that can be drained through an incision.        Assessment & Plan:  1. Axillary hidradenitis suppurativa Begin Bactrim one tablet by mouth twice a day for 7 days. Apply warm compresses to the axilla 3 times a day. Conclusions worsen she'll need to return for incision and drainage. I also recommended she return fasting for a CMP fasting lipid panel and hemoglobin A1c, for which she is overdue. - sulfamethoxazole-trimethoprim (BACTRIM DS) 800-160 MG per tablet; Take 1 tablet by mouth 2 (two) times daily.  Dispense: 14 tablet; Refill: 0  

## 2013-08-10 ENCOUNTER — Ambulatory Visit (INDEPENDENT_AMBULATORY_CARE_PROVIDER_SITE_OTHER): Payer: Medicare Other | Admitting: Family Medicine

## 2013-08-10 ENCOUNTER — Telehealth: Payer: Self-pay | Admitting: Physician Assistant

## 2013-08-10 ENCOUNTER — Encounter: Payer: Self-pay | Admitting: Family Medicine

## 2013-08-10 VITALS — BP 108/62 | HR 94 | Temp 98.9°F | Resp 16 | Ht 67.0 in | Wt 165.0 lb

## 2013-08-10 DIAGNOSIS — L732 Hidradenitis suppurativa: Secondary | ICD-10-CM

## 2013-08-10 DIAGNOSIS — IMO0001 Reserved for inherently not codable concepts without codable children: Secondary | ICD-10-CM

## 2013-08-10 DIAGNOSIS — E1165 Type 2 diabetes mellitus with hyperglycemia: Secondary | ICD-10-CM

## 2013-08-10 LAB — BASIC METABOLIC PANEL
BUN: 10 mg/dL (ref 6–23)
CALCIUM: 8.9 mg/dL (ref 8.4–10.5)
CO2: 23 meq/L (ref 19–32)
CREATININE: 0.65 mg/dL (ref 0.50–1.10)
Chloride: 100 mEq/L (ref 96–112)
GLUCOSE: 489 mg/dL — AB (ref 70–99)
Potassium: 3.8 mEq/L (ref 3.5–5.3)
Sodium: 135 mEq/L (ref 135–145)

## 2013-08-10 LAB — HEMOGLOBIN A1C
Hgb A1c MFr Bld: 14.7 % — ABNORMAL HIGH (ref <5.7)
Mean Plasma Glucose: 375 mg/dL — ABNORMAL HIGH (ref <117)

## 2013-08-10 NOTE — Telephone Encounter (Signed)
I then called phone number listed for pt.  Her mother answered. Rutherford LimerickSaid Robie not home. I asked if [name] could be reached at another phone number and she said no.  She reports that [name] will be home later this evening.  I told her to give [name] message that glucose dangerously high. She has not been using her insulin.  Told her to have Pamella give 20 units--last dose was 20 units BID.  Told her office will f/u with other labs results in next 1-2 days.

## 2013-08-10 NOTE — Progress Notes (Signed)
Subjective:    Patient ID: Kellie Pearson, female    DOB: 06-23-1965, 48 y.o.   MRN: [id number]  HPI  07/22/13 Patient has a history of abscesses. One actually put her in the hospital in DKA the past. Last week she's developed a tender erythematous nodules in her right axilla. Each is approximately 1 cm in size. They're erythematous. They're not fluctuant. There is no apparent fluid underneath the skin that can be drained. It has the characteristic appearance of hidradenitis suppurtiva.  She is overdue for evaluation for her diabetes. She is not taking her sugars regularly. She does state she's been compliant with her insulin.  At that time, my plan was:  1. Axillary hidradenitis suppurativa Begin Bactrim one tablet by mouth twice a day for 7 days. Apply warm compresses to the axilla 3 times a day. Conclusions worsen she'll need to return for incision and drainage. I also recommended she return fasting for a CMP fasting lipid panel and hemoglobin A1c, for which she is overdue. - sulfamethoxazole-trimethoprim (BACTRIM DS) 800-160 MG per tablet; Take 1 tablet by mouth 2 (two) times daily.  Dispense: 14 tablet; Refill: 0  08/10/13 Nodules resolved on her iron. Unfortunately one of the areas in the right axilla has become fluctuant erythematous warm and tender to touch. It is approximately 1.5 cm x 1 cm in size. She also never returned for a CMP/HgA1c  Past Medical History  Diagnosis Date  . Diabetes mellitus without complication   . Abscess     admitted 5/14   Current Outpatient Prescriptions on File Prior to Visit  Medication Sig Dispense Refill  . glucose monitoring kit (FREESTYLE) monitoring kit 1 each by Does not apply route 4 (four) times daily - after meals and at bedtime. 1 month Diabetic Testing Supplies for QAC-QHS accuchecks.  1 each  1  . insulin NPH-regular (NOVOLIN 70/30) (70-30) 100 UNIT/ML injection Inject 20 Units into the skin 2 (two) times daily with a meal.  10 mL  12  . insulin  regular (HUMULIN R) 100 units/mL injection Take 5 units prior to supper  10 mL  12   No current facility-administered medications on file prior to visit.   No Known Allergies History   Social History  . Marital Status: Single    Spouse Name: N/A    Number of Children: N/A  . Years of Education: N/A   Occupational History  . Not on file.   Social History Main Topics  . Smoking status: Never Smoker   . Smokeless tobacco: Not on file  . Alcohol Use: Not on file  . Drug Use: Not on file  . Sexual Activity: Not on file   Other Topics Concern  . Not on file   Social History Narrative  . No narrative on file     Review of Systems  All other systems reviewed and are negative.      Objective:   Physical Exam  Vitals reviewed. Cardiovascular: Normal rate, regular rhythm and normal heart sounds.   Pulmonary/Chest: Effort normal and breath sounds normal. No respiratory distress. She has no wheezes. She has no rales.  Skin: Rash noted. There is erythema.  1.5x1 cm abscess in right axilla.       Assessment & Plan:  1. Axillary hidradenitis suppurativa It was anesthetized with 0.1% lidocaine without epinephrine. A 1 cm horizontal incision was made into the abscess. Purulent material was drained. The wound is then probed with hemostats, Q-tips with hydrogen peroxide,  and impacted with centimeters of 1/4 inch iodoform gauze.  Wound care was discussed. Follow up next week  2. Type II or unspecified type diabetes mellitus without mention of complication, uncontrolled Check hemoglobin A1c and BMP. The patient is not taking any insulin on a regular basis. This patient is not checking her sugars at all. - Basic Metabolic Panel - Hemoglobin A1c

## 2013-08-11 ENCOUNTER — Ambulatory Visit: Payer: Medicare Other | Admitting: Family Medicine

## 2013-12-29 ENCOUNTER — Telehealth: Payer: Self-pay | Admitting: *Deleted

## 2013-12-29 NOTE — Telephone Encounter (Signed)
Pt came into office today showing me her Right armpit, she has 2-3 boils under arm and wants to know if you can prescribe her an antibiotic, states she can not come in this week d/t cousin has passed away and needs to go to her funeral. MD please advise.  [facility] [place].

## 2013-12-31 ENCOUNTER — Encounter (HOSPITAL_COMMUNITY): Payer: Self-pay | Admitting: Emergency Medicine

## 2013-12-31 ENCOUNTER — Emergency Department (HOSPITAL_COMMUNITY)
Admission: EM | Admit: 2013-12-31 | Discharge: 2013-12-31 | Disposition: A | Discharge status: Home / Self Care | Payer: Medicare Other | Attending: Emergency Medicine | Admitting: Emergency Medicine

## 2013-12-31 DIAGNOSIS — Z79899 Other long term (current) drug therapy: Secondary | ICD-10-CM | POA: Insufficient documentation

## 2013-12-31 DIAGNOSIS — Z794 Long term (current) use of insulin: Secondary | ICD-10-CM | POA: Diagnosis not present

## 2013-12-31 DIAGNOSIS — L02411 Cutaneous abscess of right axilla: Secondary | ICD-10-CM

## 2013-12-31 DIAGNOSIS — IMO0002 Reserved for concepts with insufficient information to code with codable children: Secondary | ICD-10-CM | POA: Insufficient documentation

## 2013-12-31 DIAGNOSIS — E119 Type 2 diabetes mellitus without complications: Secondary | ICD-10-CM | POA: Diagnosis not present

## 2013-12-31 DIAGNOSIS — R739 Hyperglycemia, unspecified: Secondary | ICD-10-CM

## 2013-12-31 LAB — CBC WITH DIFFERENTIAL/PLATELET
BASOS ABS: 0 10*3/uL (ref 0.0–0.1)
BASOS PCT: 0 % (ref 0–1)
EOS PCT: 1 % (ref 0–5)
Eosinophils Absolute: 0.1 10*3/uL (ref 0.0–0.7)
HEMATOCRIT: 39.1 % (ref 36.0–46.0)
Hemoglobin: 13.1 g/dL (ref 12.0–15.0)
Lymphocytes Relative: 27 % (ref 12–46)
Lymphs Abs: 1.7 10*3/uL (ref 0.7–4.0)
MCH: 27 pg (ref 26.0–34.0)
MCHC: 33.5 g/dL (ref 30.0–36.0)
MCV: 80.6 fL (ref 78.0–100.0)
MONO ABS: 0.3 10*3/uL (ref 0.1–1.0)
Monocytes Relative: 5 % (ref 3–12)
Neutro Abs: 4.2 10*3/uL (ref 1.7–7.7)
Neutrophils Relative %: 67 % (ref 43–77)
Platelets: 274 10*3/uL (ref 150–400)
RBC: 4.85 MIL/uL (ref 3.87–5.11)
RDW: 13.3 % (ref 11.5–15.5)
WBC: 6.4 10*3/uL (ref 4.0–10.5)

## 2013-12-31 LAB — BASIC METABOLIC PANEL
Anion gap: 12 (ref 5–15)
BUN: 10 mg/dL (ref 6–23)
CALCIUM: 9.3 mg/dL (ref 8.4–10.5)
CO2: 25 meq/L (ref 19–32)
CREATININE: 0.51 mg/dL (ref 0.50–1.10)
Chloride: 101 mEq/L (ref 96–112)
GFR calc Af Amer: >90 mL/min (ref >90)
GFR calc non Af Amer: >90 mL/min (ref >90)
GLUCOSE: 274 mg/dL — AB (ref 70–99)
Potassium: 3.8 mEq/L (ref 3.7–5.3)
Sodium: 138 mEq/L (ref 137–147)

## 2013-12-31 LAB — CBG MONITORING, ED: Glucose-Capillary: 348 mg/dL — ABNORMAL HIGH (ref 70–99)

## 2013-12-31 MED ORDER — LIDOCAINE HCL (PF) 2 % IJ SOLN
10.0000 mL | Freq: Once | INTRAMUSCULAR | Status: AC
  Administered 2013-12-31: 10 mL
  Filled 2013-12-31: qty 10

## 2013-12-31 MED ORDER — SULFAMETHOXAZOLE-TMP DS 800-160 MG PO TABS
1.0000 | ORAL_TABLET | Freq: Once | ORAL | Status: AC
  Administered 2013-12-31: 1 via ORAL
  Filled 2013-12-31: qty 1

## 2013-12-31 MED ORDER — POVIDONE-IODINE 10 % EX SOLN
CUTANEOUS | Status: AC
  Filled 2013-12-31: qty 118

## 2013-12-31 MED ORDER — OXYCODONE-ACETAMINOPHEN 5-325 MG PO TABS: 1.0000 | ORAL_TABLET | ORAL | Status: DC | PRN

## 2013-12-31 MED ORDER — SULFAMETHOXAZOLE-TRIMETHOPRIM 800-160 MG PO TABS: 1.0000 | ORAL_TABLET | Freq: Two times a day (BID) | ORAL | Status: DC

## 2013-12-31 MED ORDER — INSULIN ASPART 100 UNIT/ML ~~LOC~~ SOLN
8.0000 [IU] | Freq: Once | SUBCUTANEOUS | Status: AC
  Administered 2013-12-31: 8 [IU] via SUBCUTANEOUS
  Filled 2013-12-31: qty 1

## 2013-12-31 NOTE — ED Notes (Signed)
Pt reports abscess under r arm x 1 week.   

## 2013-12-31 NOTE — Discharge Instructions (Signed)
Abscess Care After An abscess (also called a boil or furuncle) is an infected area that contains a collection of pus. Signs and symptoms of an abscess include pain, tenderness, redness, or hardness, or you may feel a moveable soft area under your skin. An abscess can occur anywhere in the body. The infection may spread to surrounding tissues causing cellulitis. A cut (incision) by the surgeon was made over your abscess and the pus was drained out. Gauze may have been packed into the space to provide a drain that will allow the cavity to heal from the inside outwards. The boil may be painful for 5 to 7 days. Most people with a boil do not have high fevers. Your abscess, if seen early, may not have localized, and may not have been lanced. If not, another appointment may be required for this if it does not get better on its own or with medications. HOME CARE INSTRUCTIONS   Only take over-the-counter or prescription medicines for pain, discomfort, or fever as directed by your caregiver.  When you bathe, soak and then remove gauze or iodoform packs at least daily or as directed by your caregiver. You may then wash the wound gently with mild soapy water. Repack with gauze or do as your caregiver directs. SEEK IMMEDIATE MEDICAL CARE IF:   You develop increased pain, swelling, redness, drainage, or bleeding in the wound site.  You develop signs of generalized infection including muscle aches, chills, fever, or a general ill feeling.  An oral temperature above 102 F (38.9 C) develops, not controlled by medication. See your caregiver for a recheck if you develop any of the symptoms described above. If medications (antibiotics) were prescribed, take them as directed. Document Released: 10/26/2004 Document Revised: 07/02/2011 Document Reviewed: 06/23/2007 [facility] Patient Information 2015 [place], [place]. This information is not intended to replace advice given to you by your health care provider. Make sure  you discuss any questions you have with your health care provider.  Blood Glucose Monitoring Monitoring your blood glucose (also know as blood sugar) helps you to manage your diabetes. It also helps you and your health care provider monitor your diabetes and determine how well your treatment plan is working. WHY SHOULD YOU MONITOR YOUR BLOOD GLUCOSE?  It can help you understand how food, exercise, and medicine affect your blood glucose.  It allows you to know what your blood glucose is at any given moment. You can quickly tell if you are having low blood glucose (hypoglycemia) or high blood glucose (hyperglycemia).  It can help you and your health care provider know how to adjust your medicines.  It can help you understand how to manage an illness or adjust medicine for exercise. WHEN SHOULD YOU TEST? Your health care provider will help you decide how often you should check your blood glucose. This may depend on the type of diabetes you have, your diabetes control, or the types of medicines you are taking. Be sure to write down all of your blood glucose readings so that this information can be reviewed with your health care provider. See below for examples of testing times that your health care provider may suggest. Type 1 Diabetes  Test 4 times a day if you are in good control, using an insulin pump, or perform multiple daily injections.  If your diabetes is not well controlled or if you are sick, you may need to monitor more often.  It is a good idea to also monitor:  Before and after  exercise.  Between meals and 2 hours after a meal.  Occasionally between 2:00 a.m. and 3:00 a.m. Type 2 Diabetes  It can vary with each person, but generally, if you are on insulin, test 4 times a day.  If you take medicines by mouth (orally), test 2 times a day.  If you are on a controlled diet, test once a day.  If your diabetes is not well controlled or if you are sick, you may need to monitor  more often. HOW TO MONITOR YOUR BLOOD GLUCOSE Supplies Needed  Blood glucose meter.  Test strips for your meter. Each meter has its own strips. You must use the strips that go with your own meter.  A pricking needle (lancet).  A device that holds the lancet (lancing device).  A journal or log book to write down your results. Procedure  Wash your hands with soap and water. Alcohol is not preferred.  Prick the side of your finger (not the tip) with the lancet.  Gently milk the finger until a small drop of blood appears.  Follow the instructions that come with your meter for inserting the test strip, applying blood to the strip, and using your blood glucose meter. Other Areas to Get Blood for Testing Some meters allow you to use other areas of your body (other than your finger) to test your blood. These areas are called alternative sites. The most common alternative sites are:  The forearm.  The thigh.  The back area of the lower leg.  The palm of the hand. The blood flow in these areas is slower. Therefore, the blood glucose values you get may be delayed, and the numbers are different from what you would get from your fingers. Do not use alternative sites if you think you are having hypoglycemia. Your reading will not be accurate. Always use a finger if you are having hypoglycemia. Also, if you cannot feel your lows (hypoglycemia unawareness), always use your fingers for your blood glucose checks. ADDITIONAL TIPS FOR GLUCOSE MONITORING  Do not reuse lancets.  Always carry your supplies with you.  All blood glucose meters have a 24-hour "hotline" number to call if you have questions or need help.  Adjust (calibrate) your blood glucose meter with a control solution after finishing a few boxes of strips. BLOOD GLUCOSE RECORD KEEPING It is a good idea to keep a daily record or log of your blood glucose readings. Most glucose meters, if not all, keep your glucose records stored in  the meter. Some meters come with the ability to download your records to your home computer. Keeping a record of your blood glucose readings is especially helpful if you are wanting to look for patterns. Make notes to go along with the blood glucose readings because you might forget what happened at that exact time. Keeping good records helps you and your health care provider to work together to achieve good diabetes management.  Document Released: 04/12/2003 Document Revised: 08/24/2013 Document Reviewed: 09/01/2012 [facility] Patient Information 2015 [place], [place]. This information is not intended to replace advice given to you by your health care provider. Make sure you discuss any questions you have with your health care provider.  Take your next dose of the antibiotic tonight.  You may take the oxycodone prescribed for pain relief.  This will make you drowsy - do not drive within 4 hours of taking this medication. Make sure you are taking your diabetes medicines and keep a close watch on your  blood glucose levels.  Having and infection can make your blood glucose levels run higher than normal and can make it harder for your body to heal this infection.

## 2013-12-31 NOTE — Telephone Encounter (Signed)
Contacted pt in reference to message and pt stated that she had to go to ER to have boils drained since we could not get her in or give antibiotic in the time she wanted to be seen. Requested to be seen by [facility] for hospital follow up and she did not want to see his nurse, his NP/PA only him and if she could not see him then forget it she will not come in for follow up. Pt has been scheduled to come in on Tues Sept 15

## 2013-12-31 NOTE — Telephone Encounter (Signed)
LIkely NTBS for I+D.  SHe needs to be seen. Furthermore, her diabetes is EXTREMELY overdue to be reassessed

## 2013-12-31 NOTE — ED Notes (Signed)
PA aware pt ready for I&D

## 2014-01-01 NOTE — ED Provider Notes (Signed)
CSN: [id number]     Arrival date & time 12/31/13  0845 History   First MD Initiated Contact with Patient 12/31/13 0901     Chief Complaint  Patient presents with  . Abscess     (Consider location/radiation/quality/duration/timing/severity/associated sxs/prior Treatment) The history is provided by the patient.   Kellie Pearson is a 48 y.o. female who is an insulin dependent diabetic presenting with an abscess in her right axilla present for the past week.  She has a history of prior similar infections and was admitted here last year for an abscess in the same axilla.  She has used warm compresses without relief of pain and has been no drainage from this site.  She denies fevers or chills, nausea or vomiting.  She has not taken her insulin for the past 2 days.  When asked why she stated she thought "it might make this infection worse."      Past Medical History  Diagnosis Date  . Diabetes mellitus without complication   . Abscess     admitted 5/14   History reviewed. No pertinent past surgical history. No family history on file. History  Substance Use Topics  . Smoking status: Never Smoker   . Smokeless tobacco: Not on file  . Alcohol Use: No   OB History   Grav Para Term Preterm Abortions TAB SAB Ect Mult Living                 Review of Systems  Constitutional: Negative for fever and chills.  HENT: Negative.   Respiratory: Negative.  Negative for shortness of breath and wheezing.   Cardiovascular: Negative.   Gastrointestinal: Negative for vomiting and diarrhea.  Endocrine: Negative for polydipsia and polyuria.  Skin: Positive for wound.  Neurological: Negative for numbness.      Allergies  Review of patient's allergies indicates no known allergies.  Home Medications   Prior to Admission medications   Medication Sig Start Date End Date Taking? Authorizing Provider  glucose monitoring kit (FREESTYLE) monitoring kit 1 each by Does not apply route 4 (four) times  daily - after meals and at bedtime. 1 month Diabetic Testing Supplies for QAC-QHS accuchecks. 09/04/12  Yes Thurnell Lose, MD  insulin NPH-regular Human (NOVOLIN 70/30) (70-30) 100 UNIT/ML injection Inject 10-20 Units into the skin 2 (two) times daily with a meal. Per sliding scale   Yes Historical Provider, MD  insulin regular (NOVOLIN R,HUMULIN R) 100 units/mL injection Inject 2-10 Units into the skin daily. Per sliding scale.   Yes Historical Provider, MD  oxyCODONE-acetaminophen (PERCOCET/ROXICET) 5-325 MG per tablet Take 1 tablet by mouth every 4 (four) hours as needed. 12/31/13   [name], PA-C  sulfamethoxazole-trimethoprim (SEPTRA DS) 800-160 MG per tablet Take 1 tablet by mouth every 12 (twelve) hours. 12/31/13   [name], PA-C   BP 105/74  Pulse 102  Temp(Src) 98.5 F (36.9 C) (Oral)  Resp 20  Ht '5\' 9"'  (1.753 m)  Wt 165 lb (74.844 kg)  BMI 24.36 kg/m2  SpO2 100%  LMP 12/10/2013 Physical Exam  Nursing note and vitals reviewed. Constitutional: She appears well-developed and well-nourished.  HENT:  Head: Normocephalic and atraumatic.  Eyes: Conjunctivae are normal.  Neck: Normal range of motion.  Cardiovascular: Normal rate, regular rhythm, normal heart sounds and intact distal pulses.   Pulmonary/Chest: Effort normal and breath sounds normal.  Abdominal: Soft. Bowel sounds are normal. There is no tenderness.  Musculoskeletal: Normal range of motion.  Neurological: She is  alert.  Skin: Skin is warm and dry.  There is a 3 cm linear induration in her right axillary crease with one and this lesion being slightly fluctuant with a small pustule without spontaneous drainage.  There is no surrounding erythema.  She does have some knotty scar tissue from previous I&D.  Psychiatric: She has a normal mood and affect.    ED Course  Procedures (including critical care time)  INCISION AND DRAINAGE Performed by: [name] Consent: Verbal consent obtained. Risks and benefits:  risks, benefits and alternatives were discussed Type: abscess  Body area: right axilla  Anesthesia: local infiltration  Incision was made with a scalpel.  Local anesthetic: lidocaine 2% without epinephrine  Anesthetic total: 3 ml  Complexity: complex Blunt dissection to break up loculations  Drainage: purulent  Drainage amount: small  Packing material: no packing  Patient tolerance: Patient tolerated the procedure well with no immediate complications.    Labs Review Labs Reviewed  BASIC METABOLIC PANEL - Abnormal; Notable for the following:    Glucose, Bld 274 (*)    All other components within normal limits  CBG MONITORING, ED - Abnormal; Notable for the following:    Glucose-Capillary 348 (*)    All other components within normal limits  CBC WITH DIFFERENTIAL    Imaging Review No results found.   EKG Interpretation None      MDM   Final diagnoses:  Abscess of axilla, right  Hyperglycemia    Patients labs and/or radiological studies were viewed and considered during the medical decision making and disposition process. Time was spent with patient explaining the need for her to stay on her insulin and to check her blood sugars closely.  She now understands that infections can elevate her blood sugars which can lead to worse infections and poor response to treatments.  She was given her morning next dose of insulin and a repeat CBG is improving.  There is no evidence for DKA.  Normal.  She was placed on Bactrim with first dose given here.  Also prescribed oxycodone.  Encouraged warm compresses.  Advised that she needs to recheck by her physician on Monday which is in 4 days, but also advised to return here if she develops any worsened symptoms.    [name], PA-C 01/01/14 1835

## 2014-01-02 NOTE — ED Provider Notes (Signed)
Medical screening examination/treatment/procedure(s) were performed by non-physician practitioner and as supervising physician I was immediately available for consultation/collaboration.   EKG Interpretation None       Ione Sandusky, MD 01/02/14 0849 

## 2014-01-05 ENCOUNTER — Encounter: Payer: Self-pay | Admitting: Family Medicine

## 2014-01-05 ENCOUNTER — Ambulatory Visit (INDEPENDENT_AMBULATORY_CARE_PROVIDER_SITE_OTHER): Payer: Medicare Other | Admitting: Family Medicine

## 2014-01-05 VITALS — BP 130/76 | HR 86 | Temp 98.7°F | Resp 16 | Ht 67.0 in | Wt 172.0 lb

## 2014-01-05 DIAGNOSIS — L732 Hidradenitis suppurativa: Secondary | ICD-10-CM | POA: Diagnosis not present

## 2014-01-05 DIAGNOSIS — IMO0001 Reserved for inherently not codable concepts without codable children: Secondary | ICD-10-CM | POA: Diagnosis not present

## 2014-01-05 DIAGNOSIS — E1165 Type 2 diabetes mellitus with hyperglycemia: Secondary | ICD-10-CM

## 2014-01-05 LAB — HEMOGLOBIN A1C
Hgb A1c MFr Bld: 15.4 % — ABNORMAL HIGH (ref <5.7)
Mean Plasma Glucose: 395 mg/dL — ABNORMAL HIGH (ref <117)

## 2014-01-05 LAB — MICROALBUMIN, URINE: Microalb, Ur: 0.5 mg/dL (ref 0.00–1.89)

## 2014-01-05 MED ORDER — SULFAMETHOXAZOLE-TMP DS 800-160 MG PO TABS: 1.0000 | ORAL_TABLET | Freq: Two times a day (BID) | ORAL | Status: DC

## 2014-01-05 NOTE — Progress Notes (Signed)
Subjective:    Patient ID: Kellie Pearson, female    DOB: Dec 24, 1965, 48 y.o.   MRN: [id number]  HPI  Patient was seen in emergency room last week and underwent incision and drainage of an abscess in her right axilla which is likely due to her underlying hidradenitis suppurtiva. Patient was placed on 10 days of Bactrim. The erythema and swelling has improved in her right axilla. She still has a 5 cm x 4 cm area of firm scarred skin but there is no fluctuance. There is no pain to palpation. There is minimal erythema. There is minimal warmth. It seems to be slowly improving. I do feel that she would benefit from an extension of antibiotics for an additional 5 days.  However I'm also concerned about her uncontrolled diabetes. I last saw the patient in April and at that time showed a hemoglobin A1c approaching 15. She states that she is compliant with taking 70/30 20 units twice a day coupled with sliding scale Novolin R which he takes 2-8 units with lunch and supper. She states that her sugars have been doing well although I doubt that she is even checking her sugars. I am concerned the patient may have an undiagnosed underlying psychiatric condition/personality disorder.    Past Medical History  Diagnosis Date  . Diabetes mellitus without complication   . Abscess     admitted 5/14  . Hidradenitis suppurativa    No past surgical history on file. Current Outpatient Prescriptions on File Prior to Visit  Medication Sig Dispense Refill  . glucose monitoring kit (FREESTYLE) monitoring kit 1 each by Does not apply route 4 (four) times daily - after meals and at bedtime. 1 month Diabetic Testing Supplies for QAC-QHS accuchecks.  1 each  1  . insulin NPH-regular Human (NOVOLIN 70/30) (70-30) 100 UNIT/ML injection Inject 10-20 Units into the skin 2 (two) times daily with a meal. Per sliding scale      . insulin regular (NOVOLIN R,HUMULIN R) 100 units/mL injection Inject 2-10 Units into the skin daily. Per  sliding scale.      Marland Kitchen oxyCODONE-acetaminophen (PERCOCET/ROXICET) 5-325 MG per tablet Take 1 tablet by mouth every 4 (four) hours as needed.  15 tablet  0  . sulfamethoxazole-trimethoprim (SEPTRA DS) 800-160 MG per tablet Take 1 tablet by mouth every 12 (twelve) hours.  20 tablet  0   No current facility-administered medications on file prior to visit.   No Known Allergies History   Social History  . Marital Status: Single    Spouse Name: N/A    Number of Children: N/A  . Years of Education: N/A   Occupational History  . Not on file.   Social History Main Topics  . Smoking status: Never Smoker   . Smokeless tobacco: Not on file  . Alcohol Use: No  . Drug Use: No  . Sexual Activity: Not on file   Other Topics Concern  . Not on file   Social History Narrative  . No narrative on file     Review of Systems  All other systems reviewed and are negative.      Objective:   Physical Exam  Vitals reviewed. Cardiovascular: Normal rate, regular rhythm and normal heart sounds.   Pulmonary/Chest: Effort normal and breath sounds normal.  Abdominal: Soft. Bowel sounds are normal.  Skin: No rash noted. There is erythema.   See description in HPI.        Assessment & Plan:  Hidradenitis suppurativa of  right axilla - Plan: sulfamethoxazole-trimethoprim (BACTRIM DS) 800-160 MG per tablet  Type II or unspecified type diabetes mellitus without mention of complication, uncontrolled - Plan: Hemoglobin A1c, Microalbumin, urine   The abscess seems to be improving. Extend Bactrim for an additional 5 days. I also checked a hemoglobin A1c to monitor her glycemic control.  If the patient continues to have out-of-control diabetes, consult endocrinology. I believe her management of her diabetes is complicated by her financial situation and possibly an underlying psychologic condition.  For lack of a better medical description, patient has an odd affect and seems overly suspicious.  I intend  no disrespect in this description, but it is the most accurate way I can relate her behavior.

## 2014-01-11 ENCOUNTER — Telehealth: Payer: Self-pay | Admitting: Family Medicine

## 2014-01-11 NOTE — Telephone Encounter (Signed)
Patient left message to speak with the nurse but would not leave a reason please call her back at [phone]

## 2014-01-12 NOTE — Telephone Encounter (Signed)
Call placed to patient to inquire as to how [facility] could help patient.   States that she was told to contact office about her current situation, but would not go into details.   States that she will contact MD at later time.   Advised that Andrey Campanile is currently out of the office and would not return until next week.   MD please advise.

## 2014-03-26 ENCOUNTER — Telehealth: Payer: Self-pay | Admitting: Family Medicine

## 2014-03-26 NOTE — Telephone Encounter (Signed)
Pt walked into office to pay bill and wanted to speak to Dr. Tanya NonesPickard personally without an appointment. I walked out into the waiting area to ask if I could help her in any way and she stated that she only wanted to speak to Dr. Tanya NonesPickard that it would only take a couple of minutes of his time. I explained to her that he was seeing his scheduled pts and if she could to tell me what she wanted and I could call her back if the answers. She then proceeds to tell me she wants help with losing weight for the summer that she wants to get into a bikini to go to the beach. I explained to her that she would need an appointment so that he could discuss what medication would be good for her and discuss potential side effects. She then stated that she either needed medication to lose weight or she would be pregnant in 6 months.(all of this was discussed out of hearing range of pts in waiting area) I then informed her of the need for an appointment once again and she could discuss that with him as well. Pt agreed and will call back to schedule appointment.

## 2014-07-19 ENCOUNTER — Encounter: Payer: Self-pay | Admitting: Family Medicine

## 2014-08-23 ENCOUNTER — Encounter: Payer: Self-pay | Admitting: Family Medicine

## 2014-08-25 ENCOUNTER — Encounter: Payer: Self-pay | Admitting: Family Medicine

## 2015-06-06 ENCOUNTER — Encounter (HOSPITAL_COMMUNITY): Payer: Self-pay | Admitting: *Deleted

## 2015-06-06 ENCOUNTER — Emergency Department (HOSPITAL_COMMUNITY)
Admission: EM | Admit: 2015-06-06 | Discharge: 2015-06-06 | Disposition: A | Discharge status: Home / Self Care | Payer: Medicare Other | Attending: Emergency Medicine | Admitting: Emergency Medicine

## 2015-06-06 DIAGNOSIS — Z792 Long term (current) use of antibiotics: Secondary | ICD-10-CM | POA: Diagnosis not present

## 2015-06-06 DIAGNOSIS — Y92410 Unspecified street and highway as the place of occurrence of the external cause: Secondary | ICD-10-CM | POA: Diagnosis not present

## 2015-06-06 DIAGNOSIS — Y998 Other external cause status: Secondary | ICD-10-CM | POA: Diagnosis not present

## 2015-06-06 DIAGNOSIS — E119 Type 2 diabetes mellitus without complications: Secondary | ICD-10-CM | POA: Diagnosis not present

## 2015-06-06 DIAGNOSIS — Z872 Personal history of diseases of the skin and subcutaneous tissue: Secondary | ICD-10-CM | POA: Insufficient documentation

## 2015-06-06 DIAGNOSIS — Y9389 Activity, other specified: Secondary | ICD-10-CM | POA: Diagnosis not present

## 2015-06-06 DIAGNOSIS — S199XXA Unspecified injury of neck, initial encounter: Secondary | ICD-10-CM | POA: Diagnosis present

## 2015-06-06 DIAGNOSIS — S161XXA Strain of muscle, fascia and tendon at neck level, initial encounter: Secondary | ICD-10-CM | POA: Insufficient documentation

## 2015-06-06 DIAGNOSIS — Z794 Long term (current) use of insulin: Secondary | ICD-10-CM | POA: Insufficient documentation

## 2015-06-06 DIAGNOSIS — T148XXA Other injury of unspecified body region, initial encounter: Secondary | ICD-10-CM

## 2015-06-06 NOTE — Discharge Instructions (Signed)
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
## 2015-11-24 DIAGNOSIS — L03312 Cellulitis of back [any part except buttock]: Secondary | ICD-10-CM | POA: Diagnosis not present

## 2015-11-24 DIAGNOSIS — L03116 Cellulitis of left lower limb: Secondary | ICD-10-CM | POA: Diagnosis not present

## 2015-11-24 DIAGNOSIS — L299 Pruritus, unspecified: Secondary | ICD-10-CM | POA: Diagnosis not present

## 2015-12-01 ENCOUNTER — Emergency Department (HOSPITAL_COMMUNITY)
Admission: EM | Admit: 2015-12-01 | Discharge: 2015-12-01 | Disposition: A | Discharge status: Home / Self Care | Payer: Medicare Other | Attending: Emergency Medicine | Admitting: Emergency Medicine

## 2015-12-01 ENCOUNTER — Encounter (HOSPITAL_COMMUNITY): Payer: Self-pay

## 2015-12-01 DIAGNOSIS — L089 Local infection of the skin and subcutaneous tissue, unspecified: Secondary | ICD-10-CM | POA: Diagnosis present

## 2015-12-01 DIAGNOSIS — Z87891 Personal history of nicotine dependence: Secondary | ICD-10-CM | POA: Diagnosis not present

## 2015-12-01 DIAGNOSIS — Z794 Long term (current) use of insulin: Secondary | ICD-10-CM | POA: Diagnosis not present

## 2015-12-01 DIAGNOSIS — E119 Type 2 diabetes mellitus without complications: Secondary | ICD-10-CM | POA: Insufficient documentation

## 2015-12-01 DIAGNOSIS — L02212 Cutaneous abscess of back [any part, except buttock]: Secondary | ICD-10-CM | POA: Diagnosis not present

## 2015-12-01 DIAGNOSIS — L0291 Cutaneous abscess, unspecified: Secondary | ICD-10-CM

## 2015-12-01 DIAGNOSIS — L0390 Cellulitis, unspecified: Secondary | ICD-10-CM

## 2015-12-01 DIAGNOSIS — L03312 Cellulitis of back [any part except buttock]: Secondary | ICD-10-CM | POA: Diagnosis not present

## 2015-12-01 MED ORDER — CHLORHEXIDINE GLUCONATE 2 % EX PADS
1.0000 [IU] | MEDICATED_PAD | Freq: Every day | CUTANEOUS | 0 refills | Status: AC
Start: 2015-12-01 — End: 2015-12-11

## 2015-12-01 MED ORDER — SULFAMETHOXAZOLE-TRIMETHOPRIM 800-160 MG PO TABS: 1.0000 | ORAL_TABLET | Freq: Two times a day (BID) | ORAL | 0 refills | Status: AC

## 2015-12-01 MED ORDER — CEPHALEXIN 500 MG PO CAPS
1000.0000 mg | ORAL_CAPSULE | Freq: Once | ORAL | Status: AC
  Administered 2015-12-01: 1000 mg via ORAL
  Filled 2015-12-01: qty 2

## 2015-12-01 MED ORDER — HYDROCODONE-ACETAMINOPHEN 5-325 MG PO TABS
1.0000 | ORAL_TABLET | Freq: Once | ORAL | Status: AC
  Administered 2015-12-01: 1 via ORAL
  Filled 2015-12-01: qty 1

## 2015-12-01 MED ORDER — LIDOCAINE-EPINEPHRINE (PF) 1 %-1:200000 IJ SOLN
INTRAMUSCULAR | Status: DC
Start: 2015-12-01 — End: 2015-12-01
  Filled 2015-12-01: qty 30

## 2015-12-01 MED ORDER — LIDOCAINE-EPINEPHRINE (PF) 1 %-1:200000 IJ SOLN
10.0000 mL | Freq: Once | INTRAMUSCULAR | Status: AC
  Administered 2015-12-01: 10 mL via INTRADERMAL
  Filled 2015-12-01: qty 10

## 2015-12-01 MED ORDER — CEPHALEXIN 500 MG PO CAPS: ORAL_CAPSULE | ORAL | 0 refills | Status: DC

## 2015-12-01 NOTE — ED Notes (Signed)
Pt doesn't have anyone to drive her so she will wait in the waiting room until 4 hours post hydrocodone.  Security notified.

## 2015-12-01 NOTE — ED Notes (Signed)
Notified security that pt cannot leave and drive until [id number], will watch pt in waiting room to make sure that she does not leave.

## 2015-12-01 NOTE — ED Triage Notes (Signed)
Pt reports going to urgent care Saturday for bumps to back and received Rx for Bactrim and hydroxyzine, and has been taking them. Pt reports the medicine has helped some.

## 2015-12-01 NOTE — ED Provider Notes (Addendum)
[place] DEPT Provider Note   CSN: [id number] Arrival date & time: 12/01/15  3748  First Provider Contact:  7:10 AM       History   Chief Complaint Chief Complaint  Patient presents with  . Recurrent Skin Infections    HPI Kellie Pearson is a 50 y.o. female.  50 yo F w/ h/o hidradenitis, abscesses, DM here with swelling, redness and pain to back for about a week. Seen at [facility] on Saturday and started on bactrim, some improvement in redness, no worsening of swelling, but has not resolved.  No drainage. Similar to previous episodes but not as large. No fever, chills, nausea, vomiting or other systemic symptoms that are new.       Past Medical History:  Diagnosis Date  . Abscess    admitted 5/14  . Diabetes mellitus without complication ([place])   . Hidradenitis suppurativa     Patient Active Problem List   Diagnosis Date Noted  . Hidradenitis suppurativa   . Diabetes mellitus without complication ([place])   . Increased anion gap metabolic acidosis 27/10/8673  . DKA (diabetic ketoacidoses) ([place]) 09/02/2012  . Metabolic acidosis 44/92/0100  . Tachycardia 09/01/2012  . Skin abscess 09/01/2012    Past Surgical History:  Procedure Laterality Date  . BACK SURGERY      OB History    No data available       Home Medications    Prior to Admission medications   Medication Sig Start Date End Date Taking? Authorizing Provider  cephALEXin (KEFLEX) 500 MG capsule 2 caps po bid x 10 days 12/01/15   Merrily Pew, MD  Chlorhexidine Gluconate 2 % PADS Apply 1 Units topically daily. 12/01/15 12/11/15  Merrily Pew, MD  glucose monitoring kit (FREESTYLE) monitoring kit 1 each by Does not apply route 4 (four) times daily - after meals and at bedtime. 1 month Diabetic Testing Supplies for QAC-QHS accuchecks. 09/04/12   Thurnell Lose, MD  insulin NPH-regular Human (NOVOLIN 70/30) (70-30) 100 UNIT/ML injection Inject 10-20 Units into the skin 2 (two) times daily with a meal. Per  sliding scale    Historical Provider, MD  insulin regular (NOVOLIN R,HUMULIN R) 100 units/mL injection Inject 2-10 Units into the skin daily. Per sliding scale.    Historical Provider, MD  oxyCODONE-acetaminophen (PERCOCET/ROXICET) 5-325 MG per tablet Take 1 tablet by mouth every 4 (four) hours as needed. 12/31/13   [name], PA-C  sulfamethoxazole-trimethoprim (BACTRIM DS,SEPTRA DS) 800-160 MG tablet Take 1 tablet by mouth 2 (two) times daily. 12/01/15 12/11/15  Merrily Pew, MD    Family History No family history on file.  Social History Social History  Substance Use Topics  . Smoking status: Former Research scientist (life sciences)  . Smokeless tobacco: Never Used  . Alcohol use No     Allergies   Review of patient's allergies indicates no known allergies.   Review of Systems Review of Systems  All other systems reviewed and are negative.    Physical Exam Updated Vital Signs BP (!) 85/58 (BP Location: Right Arm)   Pulse 104   Temp 98.4 F (36.9 C) (Oral)   Resp 17   Ht '5\' 7"'  (1.702 m)   Wt 155 lb (70.3 kg)   LMP 11/30/2015 (Exact Date)   SpO2 100%   BMI 24.28 kg/m   Physical Exam  Constitutional: She appears well-developed and well-nourished. No distress.  HENT:  Head: Normocephalic and atraumatic.  Eyes: Conjunctivae are normal.  Neck: Neck supple.  Cardiovascular: Normal  rate and regular rhythm.   No murmur heard. Pulmonary/Chest: Effort normal and breath sounds normal. No respiratory distress.  Abdominal: Soft. There is no tenderness.  Musculoskeletal: She exhibits no edema.  Neurological: She is alert.  Skin: Skin is warm and dry.  Left mid back with approx 4 cm swollen, warm, red tender area with induration. No fluctuance.   Psychiatric: She has a normal mood and affect.  Nursing note and vitals reviewed.    ED Treatments / Results  Labs (all labs ordered are listed, but only abnormal results are displayed) Labs Reviewed - No data to display  EKG  EKG  Interpretation None       Radiology No results found.  Procedures .Marland KitchenIncision and Drainage Date/Time: 12/01/2015 8:54 AM Performed by: Merrily Pew Authorized by: Merrily Pew   Consent:    Consent obtained:  Verbal   Consent given by:  Patient   Risks discussed:  Bleeding, incomplete drainage and pain   Alternatives discussed:  Delayed treatment and observation Universal protocol:    Imaging studies available: yes     Site/side marked: yes     Immediately prior to procedure a time out was called: yes   Location:    Type:  Abscess   Size:  4 cm   Location:  Trunk   Trunk location:  Back Pre-procedure details:    Skin preparation:  Betadine Anesthesia (see MAR for exact dosages):    Anesthesia method:  Local infiltration   Local anesthetic:  Lidocaine 1% w/o epi Procedure type:    Complexity:  Simple Procedure details:    Needle aspiration: no     Incision types:  Elliptical   Incision depth:  Subcutaneous   Scalpel blade:  11   Wound management:  Irrigated with saline and probed and deloculated   Drainage:  Bloody and purulent   Drainage amount:  Moderate   Wound treatment:  Wound left open   Packing materials:  None Post-procedure details:    Patient tolerance of procedure:  Tolerated well, no immediate complications   (including critical care time)  EMERGENCY DEPARTMENT US SOFT TISSUE INTERPRETATION "Study: Limited Soft Tissue Ultrasound"  INDICATIONS: Pain and Soft tissue infection Multiple views of the body part were obtained in real-time with a multi-frequency linear probe PERFORMED BY:  Myself IMAGES ARCHIVED?: Yes SIDE:Left BODY PART:Lower back FINDINGS: Abcess present and Cellulitis present INTERPRETATION:  Abcess present and Cellulitis present   CPT: Lower back 86168-37  Medications Ordered in ED Medications  lidocaine-EPINEPHrine (XYLOCAINE-EPINEPHrine) 1 %-1:200000 (PF) injection 10 mL (10 mLs Intradermal Given by Other 12/01/15 0748)   cephALEXin (KEFLEX) capsule 1,000 mg (1,000 mg Oral Given 12/01/15 0747)  HYDROcodone-acetaminophen (NORCO/VICODIN) 5-325 MG per tablet 1 tablet (1 tablet Oral Given 12/01/15 0748)     Initial Impression / Assessment and Plan / ED Course  I have reviewed the triage vital signs and the nursing notes.  Pertinent labs & imaging results that were available during my care of the patient were reviewed by me and considered in my medical decision making (see chart for details).  [place] and PE c/w likely abscess and resolvign cellulitis. Will I/D and add on keflex.   In reference to initially slightly low BP, multiple rechecks were all absolutely normal, HR normal. Doubt systemic infection or sepsis. I think this was an artifact.   Clinical Course    I&D as above. Will restart course of abx and add on chlorhexidine wipes.   Final Clinical Impressions(s) / ED Diagnoses  Final diagnoses:  Cellulitis and abscess    New Prescriptions New Prescriptions   CEPHALEXIN (KEFLEX) 500 MG CAPSULE    2 caps po bid x 10 days   CHLORHEXIDINE GLUCONATE 2 % PADS    Apply 1 Units topically daily.   SULFAMETHOXAZOLE-TRIMETHOPRIM (BACTRIM DS,SEPTRA DS) 800-160 MG TABLET    Take 1 tablet by mouth 2 (two) times daily.     Merrily Pew, MD 12/01/15 4356    Merrily Pew, MD 12/01/15 [phone]

## 2015-12-01 NOTE — ED Notes (Signed)
Pt reports that she is driving herself home today.  Pt informed that she is not able to drive 4 hours after administration of vicodin and she is willing to wait out medication time before driving.  PT verbalizes understanding.

## 2016-02-22 DIAGNOSIS — R7309 Other abnormal glucose: Secondary | ICD-10-CM | POA: Diagnosis not present

## 2016-02-22 DIAGNOSIS — Z5320 Procedure and treatment not carried out because of patient's decision for unspecified reasons: Secondary | ICD-10-CM | POA: Diagnosis not present

## 2016-02-22 DIAGNOSIS — L0291 Cutaneous abscess, unspecified: Secondary | ICD-10-CM | POA: Diagnosis not present

## 2016-02-22 DIAGNOSIS — R5383 Other fatigue: Secondary | ICD-10-CM | POA: Diagnosis not present

## 2016-02-25 ENCOUNTER — Emergency Department (HOSPITAL_COMMUNITY)
Admission: EM | Admit: 2016-02-25 | Discharge: 2016-02-25 | Disposition: A | Discharge status: Home / Self Care | Payer: Medicare Other | Attending: Emergency Medicine | Admitting: Emergency Medicine

## 2016-02-25 ENCOUNTER — Encounter (HOSPITAL_COMMUNITY): Payer: Self-pay | Admitting: Emergency Medicine

## 2016-02-25 DIAGNOSIS — N764 Abscess of vulva: Secondary | ICD-10-CM | POA: Diagnosis not present

## 2016-02-25 DIAGNOSIS — E119 Type 2 diabetes mellitus without complications: Secondary | ICD-10-CM | POA: Insufficient documentation

## 2016-02-25 DIAGNOSIS — Z87891 Personal history of nicotine dependence: Secondary | ICD-10-CM | POA: Insufficient documentation

## 2016-02-25 DIAGNOSIS — Z79899 Other long term (current) drug therapy: Secondary | ICD-10-CM | POA: Diagnosis not present

## 2016-02-25 DIAGNOSIS — Z794 Long term (current) use of insulin: Secondary | ICD-10-CM | POA: Diagnosis not present

## 2016-02-25 DIAGNOSIS — N9089 Other specified noninflammatory disorders of vulva and perineum: Secondary | ICD-10-CM | POA: Diagnosis present

## 2016-02-25 LAB — CBG MONITORING, ED: Glucose-Capillary: 449 mg/dL — ABNORMAL HIGH (ref 65–99)

## 2016-02-25 MED ORDER — LIDOCAINE HCL (PF) 2 % IJ SOLN
10.0000 mL | Freq: Once | INTRAMUSCULAR | Status: AC
  Administered 2016-02-25: 10 mL
  Filled 2016-02-25: qty 10

## 2016-02-25 MED ORDER — DOXYCYCLINE HYCLATE 100 MG PO CAPS: 100.0000 mg | ORAL_CAPSULE | Freq: Two times a day (BID) | ORAL | 0 refills | Status: DC

## 2016-02-25 MED ORDER — HYDROCODONE-ACETAMINOPHEN 5-325 MG PO TABS: 2.0000 | ORAL_TABLET | ORAL | 0 refills | Status: DC | PRN

## 2016-02-25 MED ORDER — FLUCONAZOLE 150 MG PO TABS: 150.0000 mg | ORAL_TABLET | Freq: Every day | ORAL | 1 refills | Status: DC

## 2016-02-25 NOTE — ED Provider Notes (Signed)
[place] DEPT Provider Note   CSN: [id number] Arrival date & time: 02/25/16  0954  By signing my name below, I, Emmanuella Mensah, attest that this documentation has been prepared under the direction and in the presence of Milton Ferguson, MD. Electronically Signed: Judithann Sauger, ED Scribe. 02/25/16. 12:49 PM.   History   Chief Complaint Chief Complaint  Patient presents with  . Abscess    HPI Comments: Kellie Pearson is a 50 y.o. female with a hx of DM with DKA and hidradenitis suppurativa who presents to the Emergency Department complaining of gradually worsening moderately painful area of swelling to her left labia majora onset 2 weeks ago. She reports associated drainage from the area. She denies any recent known injuries or trauma to the site. No alleviating factors noted. Pt has not tried any medications PTA. She has NKDA. She reports a hx of abscess to the area that has required an I&D. She denies any fever, chills, nausea, vomiting, or any other symptoms.    The history is provided by the patient. No language interpreter was used.  Abscess  Location:  Pelvis Pelvic abscess location:  Vulva Abscess quality: draining and painful   Duration:  2 weeks Progression:  Worsening Pain details:    Severity:  Moderate   Timing:  Constant   Progression:  Worsening Chronicity:  Recurrent Context: diabetes   Relieved by:  None tried Worsened by:  Nothing Ineffective treatments:  None tried Associated symptoms: no fatigue, no fever, no headaches, no nausea and no vomiting   Risk factors: prior abscess     Past Medical History:  Diagnosis Date  . Abscess    admitted 5/14  . Diabetes mellitus without complication ([place])   . Hidradenitis suppurativa     Patient Active Problem List   Diagnosis Date Noted  . Hidradenitis suppurativa   . Diabetes mellitus without complication ([place])   . Increased anion gap metabolic acidosis 89/21/1941  . DKA (diabetic ketoacidoses) ([place])  09/02/2012  . Metabolic acidosis 74/11/1446  . Tachycardia 09/01/2012  . Skin abscess 09/01/2012    Past Surgical History:  Procedure Laterality Date  . BACK SURGERY      OB History    No data available       Home Medications    Prior to Admission medications   Medication Sig Start Date End Date Taking? Authorizing Provider  cephALEXin (KEFLEX) 500 MG capsule 2 caps po bid x 10 days 12/01/15  Yes Merrily Pew, MD  glucose monitoring kit (FREESTYLE) monitoring kit 1 each by Does not apply route 4 (four) times daily - after meals and at bedtime. 1 month Diabetic Testing Supplies for QAC-QHS accuchecks. 09/04/12  Yes Thurnell Lose, MD  hydrOXYzine (ATARAX/VISTARIL) 25 MG tablet Take 25 mg by mouth every 8 (eight) hours as needed.   Yes Historical Provider, MD  insulin NPH-regular Human (NOVOLIN 70/30) (70-30) 100 UNIT/ML injection Inject 10-20 Units into the skin 2 (two) times daily with a meal. Per sliding scale   Yes Historical Provider, MD  insulin regular (NOVOLIN R,HUMULIN R) 100 units/mL injection Inject 2-10 Units into the skin daily. Per sliding scale.   Yes Historical Provider, MD  naproxen sodium (ANAPROX) 220 MG tablet Take 220 mg by mouth 2 (two) times daily with a meal.   Yes Historical Provider, MD  sulfamethoxazole-trimethoprim (BACTRIM DS,SEPTRA DS) 800-160 MG tablet Take 1 tablet by mouth 2 (two) times daily.   Yes Historical Provider, MD    Family History No  family history on file.  Social History Social History  Substance Use Topics  . Smoking status: Former Research scientist (life sciences)  . Smokeless tobacco: Never Used  . Alcohol use No     Allergies   Review of patient's allergies indicates no known allergies.   Review of Systems Review of Systems  Constitutional: Negative for appetite change, fatigue and fever.  HENT: Negative for congestion, ear discharge and sinus pressure.   Eyes: Negative for discharge.  Respiratory: Negative for cough.   Cardiovascular: Negative  for chest pain.  Gastrointestinal: Negative for abdominal pain, diarrhea, nausea and vomiting.  Genitourinary: Negative for frequency and hematuria.  Musculoskeletal: Negative for back pain.  Skin: Positive for wound. Negative for rash.  Neurological: Negative for seizures and headaches.  Psychiatric/Behavioral: Negative for hallucinations.     Physical Exam Updated Vital Signs BP (!) 155/101   Pulse 100   Temp 98.2 F (36.8 C) (Oral)   Resp 18   Ht '5\' 8"'  (1.727 m)   Wt 165 lb (74.8 kg)   LMP 01/31/2016   SpO2 100%   BMI 25.09 kg/m   Physical Exam  Constitutional: She is oriented to person, place, and time. She appears well-developed.  HENT:  Head: Normocephalic.  Eyes: Conjunctivae are normal.  Neck: No tracheal deviation present.  Cardiovascular:  No murmur heard. Genitourinary:  Genitourinary Comments: Abscess on left labia majora  Musculoskeletal: Normal range of motion.  Neurological: She is oriented to person, place, and time.  Skin: Skin is warm.  Psychiatric: She has a normal mood and affect.     ED Treatments / Results  DIAGNOSTIC STUDIES: Oxygen Saturation is 100% on RA, normal by my interpretation.    COORDINATION OF CARE: 12:44 PM- Pt advised of plan for treatment and pt agrees. She will receive an I&D.    Labs (all labs ordered are listed, but only abnormal results are displayed) Labs Reviewed  CBG MONITORING, ED - Abnormal; Notable for the following:       Result Value   Glucose-Capillary 449 (*)    All other components within normal limits    EKG  EKG Interpretation None       Radiology No results found.  Procedures Procedures (including critical care time)  Medications Ordered in ED Medications - No data to display   Initial Impression / Assessment and Plan / ED Course  Milton Ferguson, MD has reviewed the triage vital signs and the nursing notes.  Pertinent labs & imaging results that were available during my care of the  patient were reviewed by me and considered in my medical decision making (see chart for details).  Clinical Course      Final Clinical Impressions(s) / ED Diagnoses   Final diagnoses:  None    New Prescriptions New Prescriptions   No medications on file   Medical screening examination/treatment/procedure(s) were conducted as a shared visit with non-physician practitioner(s) and myself.  I personally evaluated the patient during the encounter.   EKG Interpretation None      The chart was scribed for me under my direct supervision.  I personally performed the history, physical, and medical decision making and all procedures in the evaluation of this patient.Milton Ferguson, MD 03/03/16 0930

## 2016-02-25 NOTE — ED Notes (Signed)
Pt c/o abscess to left labia x 2 weeks. Redness and swelling to labia noted, purulent drainage noted. Pt [id number] today-pt states she has been noncompliant with obtaining blood sugars and using sliding scale insulin. Pt not on any oral diabetic medications.

## 2016-02-25 NOTE — ED Triage Notes (Signed)
Patient c/o abscess on left labia. Per patient started as "small bump" two weeks ago. Patient reports some serosanguinous drainage. Denies any fevers.

## 2016-02-25 NOTE — ED Provider Notes (Signed)
INCISION AND DRAINAGE Performed by: Langston MaskerKaren Estrella Alcaraz Consent: Verbal consent obtained. Risks and benefits: risks, benefits and alternatives were discussed Type: abscess  Body area: left labia 2 lumpy pointing areas,  I made an incision to most medial area and had small amount of drainage,  I probed area,  Frontal area opened while I was putting pressure to lower area,  I injected lidocaine and made a second incision.  This area had a "core" about 3mm in size, moderate amount of purulent drainage.  I packed this area.   Anesthesia: local infiltration  Incision was made with a scalpel   Local anesthetic: lidocaine 2 5  Anesthetic total: 5 ml  Complexity: complex Blunt dissection to break up loculations  Drainage: purulent  Drainage amount: moderate amount of purulent drainage,    Packing material: 1/4 in iodoform gauze  Patient tolerance: Patient tolerated the procedure well with no immediate complications. Pt concerned about bleeding.   Pt advised should decrease.  Pt has bleeding around packing   Pt has discolored perineal area with whitish discoloration .  Greenish scabbed area clitoris. Possible yeast given pt's diabetes.  I will treat with Bactrim, Hydrocodone and diflucan.  Pt is advised she needs to be rechecked here in 2 days.   [facility].     Lonia SkinnerLeslie K SeatonvilleSofia, PA-C 02/25/16 1545    Bethann BerkshireJoseph Zammit, MD 02/27/16 1504

## 2016-02-25 NOTE — ED Triage Notes (Signed)
Patient also reports going to Urgent Care and blood sugar was elevated. Denies checking blood sugar recently and states that she just ate. Blood sugar checked in triage 449.

## 2016-02-26 ENCOUNTER — Emergency Department (HOSPITAL_COMMUNITY)
Admission: EM | Admit: 2016-02-26 | Discharge: 2016-02-26 | Disposition: A | Discharge status: Home / Self Care | Payer: Medicare Other | Attending: Emergency Medicine | Admitting: Emergency Medicine

## 2016-02-26 ENCOUNTER — Encounter (HOSPITAL_COMMUNITY): Payer: Self-pay | Admitting: Emergency Medicine

## 2016-02-26 DIAGNOSIS — Z79899 Other long term (current) drug therapy: Secondary | ICD-10-CM | POA: Diagnosis not present

## 2016-02-26 DIAGNOSIS — Z87891 Personal history of nicotine dependence: Secondary | ICD-10-CM | POA: Insufficient documentation

## 2016-02-26 DIAGNOSIS — Z4801 Encounter for change or removal of surgical wound dressing: Secondary | ICD-10-CM | POA: Insufficient documentation

## 2016-02-26 DIAGNOSIS — E1165 Type 2 diabetes mellitus with hyperglycemia: Secondary | ICD-10-CM | POA: Insufficient documentation

## 2016-02-26 DIAGNOSIS — Z5189 Encounter for other specified aftercare: Secondary | ICD-10-CM

## 2016-02-26 DIAGNOSIS — Z4800 Encounter for change or removal of nonsurgical wound dressing: Secondary | ICD-10-CM | POA: Diagnosis not present

## 2016-02-26 DIAGNOSIS — R739 Hyperglycemia, unspecified: Secondary | ICD-10-CM

## 2016-02-26 DIAGNOSIS — Z794 Long term (current) use of insulin: Secondary | ICD-10-CM | POA: Diagnosis not present

## 2016-02-26 LAB — CBG MONITORING, ED: Glucose-Capillary: 550 mg/dL (ref 65–99)

## 2016-02-26 NOTE — ED Triage Notes (Signed)
Pt here for a recheck of wound to her labial area. Pt states the packing came out this am. Pt seen and treated yesterday for iniitial treatment. CBG checked in Triage, 550.

## 2016-02-26 NOTE — ED Provider Notes (Signed)
[facility]Y DEPT Provider Note   CSN: [id number] Arrival date & time: 02/26/16  1408     History   Chief Complaint Chief Complaint  Patient presents with  . Follow-up    HPI Kellie Pearson is a 50 y.o. female With a past medical history significant for diabetes with prior DKA and recent labial cyst status post incision and drainage yesterday who presents for wounded check. (A nurse was present for all interactions including physical exam of this patient.) Patient reports that she was seen yesterday at this facility for evaluation of a labial abscess. Patient says it was incised, drained, packing placed, and antibiotic started. She says that this morning, the packing fell out but it continues to drain. She wants to have it evaluated. It is nontender and is looking smaller than yesterday.  Of note, patient was found to have elevated glucose on triage. Patient reports that she is only interested in her abscess and does not want to be evaluated for her hyperglycemia. She says that it is not her concern right now and she will not discuss it.   Review of systems was able to be obtained and revealed no significant abnormalities.    The history is provided by the patient and medical records. No language interpreter was used.  Wound Check  This is a new problem. The current episode started yesterday. The problem occurs constantly. The problem has been gradually improving. Pertinent negatives include no chest pain, no abdominal pain, no headaches and no shortness of breath. Nothing aggravates the symptoms. Nothing relieves the symptoms. She has tried nothing for the symptoms. The treatment provided no relief.    Past Medical History:  Diagnosis Date  . Abscess    admitted 5/14  . Diabetes mellitus without complication (HCC)   . Hidradenitis suppurativa     Patient Active Problem List   Diagnosis Date Noted  . Hidradenitis suppurativa   . Diabetes mellitus without complication (HCC)     . Increased anion gap metabolic acidosis 09/02/2012  . DKA (diabetic ketoacidoses) (HCC) 09/02/2012  . Metabolic acidosis 09/01/2012  . Tachycardia 09/01/2012  . Skin abscess 09/01/2012    Past Surgical History:  Procedure Laterality Date  . BACK SURGERY      OB History    Gravida Para Term Preterm AB Living             0   SAB TAB Ectopic Multiple Live Births                   Home Medications    Prior to Admission medications   Medication Sig Start Date End Date Taking? Authorizing Provider  fluconazole (DIFLUCAN) 150 MG tablet Take 1 tablet (150 mg total) by mouth daily. 02/25/16  Yes Elson AreasLeslie K Sofia, PA-C  HYDROcodone-acetaminophen (NORCO/VICODIN) 5-325 MG tablet Take 2 tablets by mouth every 4 (four) hours as needed. 02/25/16  Yes Lonia SkinnerLeslie K Sofia, PA-C  hydrOXYzine (ATARAX/VISTARIL) 25 MG tablet Take 25 mg by mouth every 8 (eight) hours as needed.   Yes Historical Provider, MD  insulin NPH-regular Human (NOVOLIN 70/30) (70-30) 100 UNIT/ML injection Inject 10-20 Units into the skin 2 (two) times daily with a meal. Per sliding scale   Yes Historical Provider, MD  insulin regular (NOVOLIN R,HUMULIN R) 100 units/mL injection Inject 2-10 Units into the skin daily. Per sliding scale.   Yes Historical Provider, MD  sulfamethoxazole-trimethoprim (BACTRIM DS,SEPTRA DS) 800-160 MG tablet Take 1 tablet by mouth 2 (two) times daily.  Yes Historical Provider, MD  cephALEXin (KEFLEX) 500 MG capsule 2 caps po bid x 10 days Patient not taking: Reported on 02/26/2016 12/01/15   Marily Memos, MD  doxycycline (VIBRAMYCIN) 100 MG capsule Take 1 capsule (100 mg total) by mouth 2 (two) times daily. Patient not taking: Reported on 02/26/2016 02/25/16   Elson Areas, PA-C    Family History Family History  Problem Relation Age of Onset  . Diabetes Mother   . Diabetes Father     Social History Social History  Substance Use Topics  . Smoking status: Former Games developer  . Smokeless tobacco: Never  Used  . Alcohol use No     Allergies   Patient has no known allergies.   Review of Systems Review of Systems  Constitutional: Negative for activity change, appetite change, chills, diaphoresis, fatigue and fever.  HENT: Negative for congestion and rhinorrhea.   Eyes: Negative for visual disturbance.  Respiratory: Negative for cough, chest tightness, shortness of breath and stridor.   Cardiovascular: Negative for chest pain, palpitations and leg swelling.  Gastrointestinal: Negative for abdominal distention, abdominal pain, constipation, diarrhea, nausea and vomiting.  Genitourinary: Negative for difficulty urinating, dysuria, flank pain, frequency, hematuria, menstrual problem, pelvic pain, vaginal bleeding and vaginal discharge.  Musculoskeletal: Negative for back pain and neck pain.  Skin: Negative for rash and wound.  Neurological: Negative for dizziness, weakness, light-headedness, numbness and headaches.  Psychiatric/Behavioral: Negative for agitation and confusion.  All other systems reviewed and are negative.    Physical Exam Updated Vital Signs BP 125/81 (BP Location: Left Arm)   Pulse 101   Temp 98.5 F (36.9 C) (Oral)   Resp 18   Ht 5\' 8"  (1.727 m)   Wt 165 lb (74.8 kg)   LMP 01/31/2016   SpO2 100%   BMI 25.09 kg/m   Physical Exam  Constitutional: She appears well-developed and well-nourished. No distress.  HENT:  Head: Normocephalic and atraumatic.  Mouth/Throat: Oropharynx is clear and moist. No oropharyngeal exudate.  Eyes: Conjunctivae and EOM are normal. Pupils are equal, round, and reactive to light.  Neck: Normal range of motion. Neck supple.  Cardiovascular: Normal rate and regular rhythm.   No murmur heard. Pulmonary/Chest: Effort normal and breath sounds normal. No respiratory distress. She exhibits no tenderness.  Abdominal: Soft. There is no tenderness.  Genitourinary:    Pelvic exam was performed with patient supine. There is no tenderness  on the right labia. There is lesion on the left labia. There is no tenderness on the left labia.  Musculoskeletal: She exhibits no edema.  Neurological: She is alert.  Skin: Skin is warm and dry. Capillary refill takes less than 2 seconds. No rash noted. No erythema.  Psychiatric: She has a normal mood and affect.  Nursing note and vitals reviewed.    ED Treatments / Results  Labs (all labs ordered are listed, but only abnormal results are displayed) Labs Reviewed  CBG MONITORING, ED - Abnormal; Notable for the following:       Result Value   Glucose-Capillary 550 (*)    All other components within normal limits  BASIC METABOLIC PANEL  CBC  URINALYSIS, ROUTINE W REFLEX MICROSCOPIC (NOT AT [facility])  CBG MONITORING, ED    EKG  EKG Interpretation None       Radiology No results found.  Procedures Procedures (including critical care time)  Medications Ordered in ED Medications - No data to display   Initial Impression / Assessment and Plan / ED Course  I have reviewed the triage vital signs and the nursing notes.  Pertinent labs & imaging results that were available during my care of the patient were reviewed by me and considered in my medical decision making (see chart for details).  Clinical Course     Kellie Pearson is a 50 y.o. female With a past medical history significant for diabetes with prior DKA and recent labial cyst status post incision and drainage yesterday who presents for wounded check.  History and exam are seen above.  Exam revealed a swelling in the left external labia. There are two incision wounds present with clear drainage. No evidence of purulence and no bleeding. No surrounding crepitance, fluctuance, or erythema. No evidence of spreading infection.  As wound appears to be well draining and patient is on antibiotics, and it appears to be doing better per report, do not feel it needs to be repacked. Patient instructed to continue taking  antibiotics and follow up with a primary physician for this abscess.  The larger concern on the valuation however was her hyperglycemia. In triage, it was 550.  Patient adamantly refused evaluation or workup of her hyperglycemia. She reports that she has insulin she can take at home for this. She thinks that the infection is causing it to be elevated, and she is not interested in allowing us to work her up.   Patient informed that severe hyperglycemia can lead to DKA, which documentation shows the patient has had before, and can subsequently lead to death. Patient reports that she understands the risks of hyperglycemia and not treating this. Patient understands alternatives of treatment and not treatment, she understands the risks of not allowing full workup, and she understands the consequences that can come from her hyperglycemia. She still wishes to only focus on her recent abscess.  Nursing was present for this interaction and agreed that patient appears to have decision-making capacity and this will be respected.  Patient was however encouraged to treat her hyperglycemia at home and follow up with a PCP for further glucose management. Patient was told that if she decided she wanted to be worked up for her hyperglycemia, she should return to the nearest ED. Patient understood this and had no other questions.   Patient was discharged in stable condition With understanding of return precautions and follow instructions.   Final Clinical Impressions(s) / ED Diagnoses   Final diagnoses:  Visit for wound check  Hyperglycemia    New Prescriptions Discharge Medication List as of 02/26/2016  5:36 PM      Clinical Impression: 1. Visit for wound check   2. Hyperglycemia     Disposition: Discharge  Condition: Good  I have discussed the results, Dx and Tx plan with the pt(& family if present). He/she/they expressed understanding and agree(s) with the plan. Discharge instructions discussed  at great length. Strict return precautions discussed and pt &/or family have verbalized understanding of the instructions. No further questions at time of discharge.    Discharge Medication List as of 02/26/2016  5:36 PM      Follow Up: [facility] [address] [place] [id number] [phone]    [facility] [address] [id number] [address] [place] [phone]  If symptoms worsen     Heide Scaleshristopher J Tegeler, MD 02/27/16 1115

## 2016-02-26 NOTE — ED Notes (Signed)
Patient refuses treatment for hyperglycemia. Patient states, "I don't want anyone to stick me or treat me for anything other than my abscess."

## 2016-02-26 NOTE — ED Notes (Signed)
Discharge instructions discussed and patient acknowledged understanding of making sure she gets her hyperglycemia checks and followed by a physician. [facility]

## 2016-06-15 ENCOUNTER — Encounter (HOSPITAL_COMMUNITY): Payer: Self-pay | Admitting: Emergency Medicine

## 2016-06-15 ENCOUNTER — Inpatient Hospital Stay (HOSPITAL_COMMUNITY)
Admission: EM | Admit: 2016-06-15 | Discharge: 2016-06-18 | DRG: 344 | Disposition: A | Discharge status: Home / Self Care | Payer: Medicare Other | Attending: Internal Medicine | Admitting: Internal Medicine

## 2016-06-15 DIAGNOSIS — Z833 Family history of diabetes mellitus: Secondary | ICD-10-CM | POA: Diagnosis not present

## 2016-06-15 DIAGNOSIS — K611 Rectal abscess: Principal | ICD-10-CM | POA: Diagnosis present

## 2016-06-15 DIAGNOSIS — E118 Type 2 diabetes mellitus with unspecified complications: Secondary | ICD-10-CM | POA: Diagnosis not present

## 2016-06-15 DIAGNOSIS — E1310 Other specified diabetes mellitus with ketoacidosis without coma: Secondary | ICD-10-CM | POA: Diagnosis not present

## 2016-06-15 DIAGNOSIS — L0291 Cutaneous abscess, unspecified: Secondary | ICD-10-CM | POA: Diagnosis present

## 2016-06-15 DIAGNOSIS — K353 Acute appendicitis with localized peritonitis: Secondary | ICD-10-CM | POA: Diagnosis not present

## 2016-06-15 DIAGNOSIS — Z794 Long term (current) use of insulin: Secondary | ICD-10-CM | POA: Diagnosis not present

## 2016-06-15 DIAGNOSIS — E871 Hypo-osmolality and hyponatremia: Secondary | ICD-10-CM | POA: Diagnosis present

## 2016-06-15 DIAGNOSIS — Z23 Encounter for immunization: Secondary | ICD-10-CM

## 2016-06-15 DIAGNOSIS — Z5900 Homelessness unspecified: Secondary | ICD-10-CM

## 2016-06-15 DIAGNOSIS — E1142 Type 2 diabetes mellitus with diabetic polyneuropathy: Secondary | ICD-10-CM | POA: Diagnosis present

## 2016-06-15 DIAGNOSIS — D72829 Elevated white blood cell count, unspecified: Secondary | ICD-10-CM | POA: Diagnosis present

## 2016-06-15 DIAGNOSIS — Z87891 Personal history of nicotine dependence: Secondary | ICD-10-CM | POA: Diagnosis not present

## 2016-06-15 DIAGNOSIS — G629 Polyneuropathy, unspecified: Secondary | ICD-10-CM

## 2016-06-15 DIAGNOSIS — E1110 Type 2 diabetes mellitus with ketoacidosis without coma: Secondary | ICD-10-CM | POA: Diagnosis not present

## 2016-06-15 DIAGNOSIS — E1010 Type 1 diabetes mellitus with ketoacidosis without coma: Secondary | ICD-10-CM | POA: Diagnosis not present

## 2016-06-15 LAB — BASIC METABOLIC PANEL
ANION GAP: 20 — AB (ref 5–15)
ANION GAP: 11 (ref 5–15)
ANION GAP: 7 (ref 5–15)
BUN: 12 mg/dL (ref 6–20)
BUN: 9 mg/dL (ref 6–20)
BUN: 9 mg/dL (ref 6–20)
CALCIUM: 8.9 mg/dL (ref 8.9–10.3)
CALCIUM: 8.6 mg/dL — AB (ref 8.9–10.3)
CHLORIDE: 96 mmol/L — AB (ref 101–111)
CO2: 13 mmol/L — AB (ref 22–32)
CO2: 20 mmol/L — AB (ref 22–32)
CO2: 22 mmol/L (ref 22–32)
Calcium: 8.9 mg/dL (ref 8.9–10.3)
Chloride: 106 mmol/L (ref 101–111)
Chloride: 109 mmol/L (ref 101–111)
Creatinine, Ser: 1.09 mg/dL — ABNORMAL HIGH (ref 0.44–1.00)
Creatinine, Ser: 0.79 mg/dL (ref 0.44–1.00)
Creatinine, Ser: 0.56 mg/dL (ref 0.44–1.00)
GFR calc Af Amer: >60 mL/min (ref >60)
GFR calc non Af Amer: 59 mL/min — ABNORMAL LOW (ref >60)
GLUCOSE: 612 mg/dL — AB (ref 65–99)
GLUCOSE: 307 mg/dL — AB (ref 65–99)
Glucose, Bld: 129 mg/dL — ABNORMAL HIGH (ref 65–99)
POTASSIUM: 3.6 mmol/L (ref 3.5–5.1)
POTASSIUM: 3.8 mmol/L (ref 3.5–5.1)
Potassium: 4.3 mmol/L (ref 3.5–5.1)
Sodium: 129 mmol/L — ABNORMAL LOW (ref 135–145)
Sodium: 137 mmol/L (ref 135–145)
Sodium: 138 mmol/L (ref 135–145)

## 2016-06-15 LAB — GLUCOSE, CAPILLARY
GLUCOSE-CAPILLARY: 127 mg/dL — AB (ref 65–99)
GLUCOSE-CAPILLARY: 129 mg/dL — AB (ref 65–99)
Glucose-Capillary: 307 mg/dL — ABNORMAL HIGH (ref 65–99)
Glucose-Capillary: 170 mg/dL — ABNORMAL HIGH (ref 65–99)
Glucose-Capillary: 186 mg/dL — ABNORMAL HIGH (ref 65–99)

## 2016-06-15 LAB — URINALYSIS, ROUTINE W REFLEX MICROSCOPIC
BACTERIA UA: NONE SEEN
Bilirubin Urine: NEGATIVE
Ketones, ur: 80 mg/dL — AB
Leukocytes, UA: NEGATIVE
Nitrite: NEGATIVE
PH: 5 (ref 5.0–8.0)
PROTEIN: NEGATIVE mg/dL
Specific Gravity, Urine: 1.02 (ref 1.005–1.030)

## 2016-06-15 LAB — CBC WITH DIFFERENTIAL/PLATELET
Basophils Absolute: 0 10*3/uL (ref 0.0–0.1)
Basophils Relative: 0 %
Eosinophils Absolute: 0 10*3/uL (ref 0.0–0.7)
Eosinophils Relative: 0 %
HEMATOCRIT: 36.5 % (ref 36.0–46.0)
Hemoglobin: 12 g/dL (ref 12.0–15.0)
LYMPHS ABS: 1.5 10*3/uL (ref 0.7–4.0)
LYMPHS PCT: 11 %
MCH: 27.2 pg (ref 26.0–34.0)
MCHC: 32.9 g/dL (ref 30.0–36.0)
MCV: 82.8 fL (ref 78.0–100.0)
MONO ABS: 1 10*3/uL (ref 0.1–1.0)
MONOS PCT: 7 %
NEUTROS ABS: 10.9 10*3/uL — AB (ref 1.7–7.7)
Neutrophils Relative %: 82 %
Platelets: 292 10*3/uL (ref 150–400)
RBC: 4.41 MIL/uL (ref 3.87–5.11)
RDW: 13.5 % (ref 11.5–15.5)
WBC: 13.5 10*3/uL — ABNORMAL HIGH (ref 4.0–10.5)

## 2016-06-15 LAB — CBG MONITORING, ED
Glucose-Capillary: 527 mg/dL (ref 65–99)
Glucose-Capillary: 458 mg/dL — ABNORMAL HIGH (ref 65–99)
Glucose-Capillary: 430 mg/dL — ABNORMAL HIGH (ref 65–99)
Glucose-Capillary: 334 mg/dL — ABNORMAL HIGH (ref 65–99)

## 2016-06-15 LAB — RAPID URINE DRUG SCREEN, HOSP PERFORMED
AMPHETAMINES: NOT DETECTED
BENZODIAZEPINES: NOT DETECTED
Barbiturates: NOT DETECTED
COCAINE: NOT DETECTED
OPIATES: NOT DETECTED
Tetrahydrocannabinol: NOT DETECTED

## 2016-06-15 LAB — PREGNANCY, URINE: Preg Test, Ur: NEGATIVE

## 2016-06-15 LAB — MRSA PCR SCREENING: MRSA by PCR: NEGATIVE

## 2016-06-15 MED ORDER — INSULIN REGULAR HUMAN 100 UNIT/ML IJ SOLN
INTRAMUSCULAR | Status: DC
  Filled 2016-06-15: qty 2.5

## 2016-06-15 MED ORDER — ACETAMINOPHEN 325 MG PO TABS: 650.0000 mg | ORAL_TABLET | Freq: Four times a day (QID) | ORAL | Status: DC | PRN

## 2016-06-15 MED ORDER — SODIUM CHLORIDE 0.9 % IV SOLN: INTRAVENOUS | Status: DC

## 2016-06-15 MED ORDER — LIDOCAINE HCL (PF) 1 % IJ SOLN
INTRAMUSCULAR | Status: AC
  Administered 2016-06-15: 5 mL
  Filled 2016-06-15: qty 10

## 2016-06-15 MED ORDER — INSULIN ASPART PROT & ASPART (70-30 MIX) 100 UNIT/ML ~~LOC~~ SUSP
10.0000 [IU] | Freq: Two times a day (BID) | SUBCUTANEOUS | Status: DC
  Administered 2016-06-16 – 2016-06-17 (×3): 10 [IU] via SUBCUTANEOUS
  Filled 2016-06-15: qty 10

## 2016-06-15 MED ORDER — ONDANSETRON HCL 4 MG/2ML IJ SOLN: 4.0000 mg | Freq: Four times a day (QID) | INTRAMUSCULAR | Status: DC | PRN

## 2016-06-15 MED ORDER — SODIUM CHLORIDE 0.9 % IV SOLN
INTRAVENOUS | Status: DC
  Administered 2016-06-15: 19:00:00 via INTRAVENOUS

## 2016-06-15 MED ORDER — SODIUM CHLORIDE 0.9 % IV BOLUS (SEPSIS)
1000.0000 mL | Freq: Once | INTRAVENOUS | Status: AC
  Administered 2016-06-15: 1000 mL via INTRAVENOUS

## 2016-06-15 MED ORDER — INFLUENZA VAC SPLIT QUAD 0.5 ML IM SUSY
0.5000 mL | PREFILLED_SYRINGE | INTRAMUSCULAR | Status: AC
  Administered 2016-06-16: 0.5 mL via INTRAMUSCULAR
  Filled 2016-06-15: qty 0.5

## 2016-06-15 MED ORDER — GABAPENTIN 100 MG PO CAPS
100.0000 mg | ORAL_CAPSULE | Freq: Three times a day (TID) | ORAL | Status: DC
  Administered 2016-06-15 – 2016-06-18 (×8): 100 mg via ORAL
  Filled 2016-06-15 (×8): qty 1

## 2016-06-15 MED ORDER — CLINDAMYCIN PHOSPHATE 600 MG/50ML IV SOLN
600.0000 mg | Freq: Three times a day (TID) | INTRAVENOUS | Status: DC
  Administered 2016-06-15 – 2016-06-18 (×8): 600 mg via INTRAVENOUS
  Filled 2016-06-15 (×12): qty 50

## 2016-06-15 MED ORDER — ONDANSETRON HCL 4 MG PO TABS: 4.0000 mg | ORAL_TABLET | Freq: Four times a day (QID) | ORAL | Status: DC | PRN

## 2016-06-15 MED ORDER — INSULIN REGULAR HUMAN 100 UNIT/ML IJ SOLN
INTRAMUSCULAR | Status: DC
  Administered 2016-06-15: 7.4 [IU]/h via INTRAVENOUS
  Filled 2016-06-15: qty 2.5

## 2016-06-15 MED ORDER — IBUPROFEN 600 MG PO TABS: 600.0000 mg | ORAL_TABLET | Freq: Four times a day (QID) | ORAL | Status: DC | PRN

## 2016-06-15 MED ORDER — DEXTROSE-NACL 5-0.45 % IV SOLN: INTRAVENOUS | Status: DC

## 2016-06-15 MED ORDER — SODIUM CHLORIDE 0.9 % IV SOLN: INTRAVENOUS | Status: AC

## 2016-06-15 MED ORDER — DEXTROSE-NACL 5-0.45 % IV SOLN
INTRAVENOUS | Status: DC
  Administered 2016-06-15: 21:00:00 via INTRAVENOUS

## 2016-06-15 MED ORDER — ENOXAPARIN SODIUM 40 MG/0.4ML ~~LOC~~ SOLN
40.0000 mg | SUBCUTANEOUS | Status: DC
  Administered 2016-06-15 – 2016-06-17 (×3): 40 mg via SUBCUTANEOUS
  Filled 2016-06-15 (×3): qty 0.4

## 2016-06-15 MED ORDER — ACETAMINOPHEN 650 MG RE SUPP: 650.0000 mg | Freq: Four times a day (QID) | RECTAL | Status: DC | PRN

## 2016-06-15 MED ORDER — POTASSIUM CHLORIDE CRYS ER 20 MEQ PO TBCR
40.0000 meq | EXTENDED_RELEASE_TABLET | Freq: Two times a day (BID) | ORAL | Status: DC
  Administered 2016-06-15 – 2016-06-18 (×6): 40 meq via ORAL
  Filled 2016-06-15 (×2): qty 2
  Filled 2016-06-15: qty 4
  Filled 2016-06-15 (×3): qty 2

## 2016-06-15 MED ORDER — INSULIN ASPART 100 UNIT/ML ~~LOC~~ SOLN
8.0000 [IU] | Freq: Once | SUBCUTANEOUS | Status: AC
  Administered 2016-06-15: 8 [IU] via INTRAVENOUS
  Filled 2016-06-15: qty 1

## 2016-06-15 MED ORDER — SODIUM CHLORIDE 0.9 % IV SOLN
INTRAVENOUS | Status: DC
  Administered 2016-06-15: 4 [IU]/h via INTRAVENOUS
  Filled 2016-06-15: qty 2.5

## 2016-06-15 MED ORDER — CLINDAMYCIN PHOSPHATE 600 MG/50ML IV SOLN
INTRAVENOUS | Status: AC
  Filled 2016-06-15: qty 100

## 2016-06-15 MED ORDER — LIDOCAINE HCL (PF) 1 % IJ SOLN
5.0000 mL | Freq: Once | INTRAMUSCULAR | Status: AC
  Administered 2016-06-15: 5 mL

## 2016-06-15 NOTE — H&P (Signed)
History and Physical    Kellie Pearson WJX:[id number] DOB: 12/19/1965 DOA: 06/15/2016  PCP: Leo Grosser, MD Patient coming from: home  Chief Complaint: poor appetite  HPI: Kellie Pearson is a 51 y.o. female with medical history significant of homelessness, diabetes, hidradenitis.  Patient very emphatically states that her only symptom prior to coming into the hospital was a poor appetite and perirectal pain. Patient states that she is homeless and unable to care for her medical conditions properly due to funding. Patient never takes her sugar and rarely takes her insulin. Patient states that all of her friends and family orally interested in taking from her and not helping her. Patient denies any discharge from perirectal area of tenderness. States the area has been swollen and red for about 5 days. Patient is try to keep the area very clean without improvement. Patient has not done anything to help resolve her appetite and states that every time she tries to eat she is uninterested in food. Denies any fevers, myalgias, confusion, abdominal pain, nausea, vomiting, diarrhea, dysuria, frequency, chest pain, palpitations, fatigue.  ED Course: Addictive findings outlined below. Perirectal abscess drained by PA in ED with packing in place.  Review of Systems: As per HPI otherwise 10 point review of systems negative.   Ambulatory Status: No restrictions  Past Medical History:  Diagnosis Date  . Abscess    admitted 5/14  . Diabetes mellitus without complication (HCC)   . Hidradenitis suppurativa     Past Surgical History:  Procedure Laterality Date  . BACK SURGERY      Social History   Social History  . Marital status: Single    Spouse name: N/A  . Number of children: N/A  . Years of education: N/A   Occupational History  . Not on file.   Social History Main Topics  . Smoking status: Former Games developer  . Smokeless tobacco: Never Used  . Alcohol use No  . Drug use: No  .  Sexual activity: No   Other Topics Concern  . Not on file   Social History Narrative  . No narrative on file    No Known Allergies  Family History  Problem Relation Age of Onset  . Diabetes Mother   . Diabetes Father     Prior to Admission medications   Medication Sig Start Date End Date Taking? Authorizing Provider  insulin NPH-regular Human (NOVOLIN 70/30) (70-30) 100 UNIT/ML injection Inject 10-20 Units into the skin 2 (two) times daily with a meal. Per sliding scale   Yes Historical Provider, MD  cephALEXin (KEFLEX) 500 MG capsule 2 caps po bid x 10 days Patient not taking: Reported on 06/15/2016 12/01/15   Marily Memos, MD  doxycycline (VIBRAMYCIN) 100 MG capsule Take 1 capsule (100 mg total) by mouth 2 (two) times daily. Patient not taking: Reported on 06/15/2016 02/25/16   Elson Areas, PA-C  fluconazole (DIFLUCAN) 150 MG tablet Take 1 tablet (150 mg total) by mouth daily. Patient not taking: Reported on 06/15/2016 02/25/16   Elson Areas, PA-C  HYDROcodone-acetaminophen (NORCO/VICODIN) 5-325 MG tablet Take 2 tablets by mouth every 4 (four) hours as needed. Patient not taking: Reported on 06/15/2016 02/25/16   Elson Areas, [place]    Physical Exam: Vitals:   06/15/16 1600 06/15/16 1630 06/15/16 1700 06/15/16 1800  BP: 116/69 105/72 94/73 102/63  Pulse: 107 106 106 107  Resp:  18 18 19   Temp:      TempSrc:  SpO2: 99% 100% 100% 100%  Weight:      Height:         General:  Appears calm and comfortable Eyes:  PERRL, EOMI, normal lids, iris ENT:  grossly normal hearing, lips & tongue, mmm Neck:  no LAD, masses or thyromegaly Cardiovascular:  RRR, no m/r/g. No LE edema.  Respiratory:  CTA bilaterally, no w/r/r. Normal respiratory effort. Abdomen:  soft, ntnd, NABS Skin:  At time of my examination patient was not interested in allowing me to see the area of abscess and packing due to pain and just getting back in bed from using the bathroom. Musculoskeletal:   grossly normal tone BUE/BLE, good ROM, no bony abnormality Psychiatric:  grossly normal mood and affect, speech fluent and appropriate, AOx3 Neurologic:  CN 2-12 grossly intact, moves all extremities in coordinated fashion, sensation intact  Labs on Admission: I have personally reviewed following labs and imaging studies  CBC:  Recent Labs Lab 06/15/16 1432  WBC 13.5*  NEUTROABS 10.9*  HGB 12.0  HCT 36.5  MCV 82.8  PLT 292   Basic Metabolic Panel:  Recent Labs Lab 06/15/16 1432  NA 129*  K 4.3  CL 96*  CO2 13*  GLUCOSE 612*  BUN 12  CREATININE 1.09*  CALCIUM 8.9   GFR: Estimated Creatinine Clearance: 61.4 mL/min (by C-G formula based on SCr of 1.09 mg/dL (H)). Liver Function Tests: No results for input(s): AST, ALT, ALKPHOS, BILITOT, PROT, ALBUMIN in the last 168 hours. No results for input(s): LIPASE, AMYLASE in the last 168 hours. No results for input(s): AMMONIA in the last 168 hours. Coagulation Profile: No results for input(s): INR, PROTIME in the last 168 hours. Cardiac Enzymes: No results for input(s): CKTOTAL, CKMB, CKMBINDEX, TROPONINI in the last 168 hours. BNP (last 3 results) No results for input(s): PROBNP in the last 8760 hours. HbA1C: No results for input(s): HGBA1C in the last 72 hours. CBG:  Recent Labs Lab 06/15/16 1400 06/15/16 1607 06/15/16 1707 06/15/16 1810  GLUCAP 527* 458* 430* 334*   Lipid Profile: No results for input(s): CHOL, HDL, LDLCALC, TRIG, CHOLHDL, LDLDIRECT in the last 72 hours. Thyroid Function Tests: No results for input(s): TSH, T4TOTAL, FREET4, T3FREE, THYROIDAB in the last 72 hours. Anemia Panel: No results for input(s): VITAMINB12, FOLATE, FERRITIN, TIBC, IRON, RETICCTPCT in the last 72 hours. Urine analysis:    Component Value Date/Time   COLORURINE STRAW (A) 06/15/2016 1615   APPEARANCEUR CLEAR 06/15/2016 1615   LABSPEC 1.020 06/15/2016 1615   PHURINE 5.0 06/15/2016 1615   GLUCOSEU >=500 (A) 06/15/2016  1615   HGBUR SMALL (A) 06/15/2016 1615   BILIRUBINUR NEGATIVE 06/15/2016 1615   KETONESUR 80 (A) 06/15/2016 1615   PROTEINUR NEGATIVE 06/15/2016 1615   UROBILINOGEN 0.2 09/02/2012 0047   NITRITE NEGATIVE 06/15/2016 1615   LEUKOCYTESUR NEGATIVE 06/15/2016 1615    Creatinine Clearance: Estimated Creatinine Clearance: 61.4 mL/min (by C-G formula based on SCr of 1.09 mg/dL (H)).  Sepsis Labs: @LABRCNTIP (procalcitonin:4,lacticidven:4) )No results found for this or any previous visit (from the past 240 hour(s)).   Radiological Exams on Admission: No results found.   Assessment/Plan Active Problems:   Skin abscess   DKA (diabetic ketoacidoses) (HCC)   Hyponatremia   Diabetes mellitus with complication (HCC)   Peripheral neuropathy (HCC)   Homelessness   Leukocytosis   DKA: Glucose 612, anion gap 20, WBC 13.5. Patient does not use insulin nor does she check her sugar at home due to social situation. See below. Initiated  on glucose stabilizer in ED. - Glucose stabilizer - Transition to sliding scale insulin  - Resume home 70/30 in am (ordered) - Diabetes education  Perirectal abscess: Drained by EDP. Packing in place. Patient has had similar problems in the past. - Clindamycin - Follow-up on cultures - HIV - Consider imaging if not improving - Remove packing over the next several days  Pseudohyponatremia: 129 admission. Glucose 612. Technically in the normal range. - Frequent BMP  Peripheral neuropathy: pt states present since childhood and getting worse. Lost to f/u due to homelessness  - Start Neurontin 100 TID  Social: Patient states she is homeless and all of her friends family and other associates only try to take from her. - Social work consult - UDS  Leukocytosis: 13.5 with left shift. Suspect due to DKA and perirectal abscess. - CBC in a.m.   DVT prophylaxis: Lovenox  Code Status: full  Family Communication: none  Disposition Plan: pending improvmeent     Consults called: none  Admission status: SDU, inpt    Kazim Corrales J MD [facility]  If 7PM-7AM, please contact night-coverage [email or web address] Password [facility]  06/15/2016, 6:28 PM

## 2016-06-15 NOTE — ED Notes (Signed)
CRITICAL VALUE ALERT  Critical value received: glucose 612  Date of notification:  06/15/16  Time of notification:  1522  Critical value read back: yes  Nurse who received alert:  Darleen Crockerobin G  MD notified (1st page):  Dr. Hyacinth MeekerMiller

## 2016-06-15 NOTE — ED Triage Notes (Signed)
PT c/o abscess with no drainage that started a few days ago and denies any fevers.

## 2016-06-15 NOTE — ED Provider Notes (Signed)
The patient is a 51 year old female, she has known diabetes, it is known that this is a poorly controlled illness for this patient who has known peripheral neuropathy. She has had several days of worsening nausea, no appetite, she denies abdominal pain. She has presented with an abscess in the perirectal area which was drained by the advanced practice provider. On exam the patient does have mild tachycardia approximately 110 bpm. She does not appear to be clinically toxic however her blood work is concerning in that it shows that she has diabetic ketoacidosis. This is not terribly surprising as the patient is known to be noncompliant with her medications.  Potassium is 4.3, glucose 612, carbon dioxide 13, white blood cell count 13.5. She has an anion gap of 20, urinalysis is pending.  I had a long discussion with her, the patient is unsure whether she wants to stay in the hospital. She is not convinced that intravenous drip of insulin will fix her problem. She states that she does not feel as sick as we think she is in his contemplating not staying in the hospital. I discussed with her that this would be leaving AGAINST MEDICAL ADVICE and she was able to express understanding. She has not made up her mind yet. We'll continue to have discussions with the patient.  Ms Scherrie Batemandol PA-C will continue to discuss. Pt needs insulin drip  CRITICAL CARE Performed by: Vida RollerBrian D Lavender Stanke Total critical care time: 35 minutes Critical care time was exclusive of separately billable procedures and treating other patients. Critical care was necessary to treat or prevent imminent or life-threatening deterioration. Critical care was time spent personally by me on the following activities: development of treatment plan with patient and/or surrogate as well as nursing, discussions with consultants, evaluation of patient's response to treatment, examination of patient, obtaining history from patient or surrogate, ordering and  performing treatments and interventions, ordering and review of laboratory studies, ordering and review of radiographic studies, pulse oximetry and re-evaluation of patient's condition.   Medical screening examination/treatment/procedure(s) were conducted as a shared visit with non-physician practitioner(s) and myself.  I personally evaluated the patient during the encounter.  Clinical Impression:   Final diagnoses:  Diabetic ketoacidosis without coma associated with type 2 diabetes mellitus (HCC)  Perirectal abscess         Eber HongBrian Chaz Mcglasson, MD 06/21/16 1553

## 2016-06-15 NOTE — ED Provider Notes (Signed)
[facility]Y DEPT Provider Note   CSN: [id number] Arrival date & time: 06/15/16  1323     History   Chief Complaint Chief Complaint  Patient presents with  . Abscess    HPI Kellie Pearson is a 51 y.o. female with a history of uncontrolled DM,  Reporting she has not had her medications in more than a month, stating " I have to choose between my medicine and being homeless" presenting with complaint of a new buttock abscess worsening over the past week.  She denies nausea, vomiting, fevers, abdominal pain but does endorse loss of appetite.  She has not checked her blood glucose recently.  She also has had no treatment for the abscess prior to arrival.  The history is provided by the patient.    Past Medical History:  Diagnosis Date  . Abscess    admitted 5/14  . Diabetes mellitus without complication (HCC)   . Hidradenitis suppurativa     Patient Active Problem List   Diagnosis Date Noted  . Hidradenitis suppurativa   . Diabetes mellitus without complication (HCC)   . Increased anion gap metabolic acidosis 09/02/2012  . DKA (diabetic ketoacidoses) (HCC) 09/02/2012  . Metabolic acidosis 09/01/2012  . Tachycardia 09/01/2012  . Skin abscess 09/01/2012    Past Surgical History:  Procedure Laterality Date  . BACK SURGERY      OB History    Gravida Para Term Preterm AB Living   0 0 0 0 0 0   SAB TAB Ectopic Multiple Live Births   0 0 0 0 0       Home Medications    Prior to Admission medications   Medication Sig Start Date End Date Taking? Authorizing Provider  insulin NPH-regular Human (NOVOLIN 70/30) (70-30) 100 UNIT/ML injection Inject 10-20 Units into the skin 2 (two) times daily with a meal. Per sliding scale   Yes Historical Provider, MD  cephALEXin (KEFLEX) 500 MG capsule 2 caps po bid x 10 days Patient not taking: Reported on 06/15/2016 12/01/15   Marily Memos, MD  doxycycline (VIBRAMYCIN) 100 MG capsule Take 1 capsule (100 mg total) by mouth 2 (two)  times daily. Patient not taking: Reported on 06/15/2016 02/25/16   Elson Areas, PA-C  fluconazole (DIFLUCAN) 150 MG tablet Take 1 tablet (150 mg total) by mouth daily. Patient not taking: Reported on 06/15/2016 02/25/16   Elson Areas, PA-C  HYDROcodone-acetaminophen (NORCO/VICODIN) 5-325 MG tablet Take 2 tablets by mouth every 4 (four) hours as needed. Patient not taking: Reported on 06/15/2016 02/25/16   Elson Areas, PA-C    Family History Family History  Problem Relation Age of Onset  . Diabetes Mother   . Diabetes Father     Social History Social History  Substance Use Topics  . Smoking status: Former Games developer  . Smokeless tobacco: Never Used  . Alcohol use No     Allergies   Patient has no known allergies.   Review of Systems Review of Systems  Constitutional: Positive for appetite change. Negative for fatigue and fever.  Respiratory: Negative for cough.   Cardiovascular: Negative for chest pain.  Gastrointestinal: Negative for abdominal pain, diarrhea, nausea and vomiting.  Endocrine: Negative for polydipsia and polyphagia.  Skin: Positive for color change.       Negative except as mentioned in HPI.   All other systems reviewed and are negative.    Physical Exam Updated Vital Signs BP 116/69   Pulse 107   Temp 97.6 F (  36.4 C) (Oral)   Resp 18   Ht 5\' 7"  (1.702 m)   Wt 72.6 kg   LMP 05/30/2016   SpO2 99%   BMI 25.06 kg/m   Physical Exam  Constitutional: She appears well-developed and well-nourished.  HENT:  Head: Normocephalic and atraumatic.  Left Ear: External ear normal.  Mouth/Throat: Mucous membranes are dry. No oropharyngeal exudate.  Eyes: Conjunctivae are normal.  Neck: Normal range of motion.  Cardiovascular: Normal rate, regular rhythm, normal heart sounds and intact distal pulses.   Pulmonary/Chest: Effort normal and breath sounds normal. She has no wheezes.  Abdominal: Soft. Bowel sounds are normal. There is no tenderness.    Musculoskeletal: Normal range of motion.  Neurological: She is alert.  Skin: Skin is warm and dry.  Induration of left buttock approaching the perirectal area without communication with the rectum. Area measures approx 4 x 4 cm .  No drainage.   Nursing note and vitals reviewed.    ED Treatments / Results  Labs (all labs ordered are listed, but only abnormal results are displayed) Labs Reviewed  BASIC METABOLIC PANEL - Abnormal; Notable for the following:       Result Value   Sodium 129 (*)    Chloride 96 (*)    CO2 13 (*)    Glucose, Bld 612 (*)    Creatinine, Ser 1.09 (*)    GFR calc non Af Amer 59 (*)    Anion gap 20 (*)    All other components within normal limits  CBC WITH DIFFERENTIAL/PLATELET - Abnormal; Notable for the following:    WBC 13.5 (*)    Neutro Abs 10.9 (*)    All other components within normal limits  CBG MONITORING, ED - Abnormal; Notable for the following:    Glucose-Capillary 527 (*)    All other components within normal limits  CBG MONITORING, ED - Abnormal; Notable for the following:    Glucose-Capillary 458 (*)    All other components within normal limits  URINALYSIS, ROUTINE W REFLEX MICROSCOPIC  PREGNANCY, URINE    EKG  EKG Interpretation None       Radiology No results found.  Procedures Procedures (including critical care time)  INCISION AND DRAINAGE Performed by: Burgess Amor Consent: Verbal consent obtained. Risks and benefits: risks, benefits and alternatives were discussed Type: abscess  Body area: left buttock  Anesthesia: local infiltration  Incision was made with a scalpel.  Local anesthetic: lidocaine 1% without epinephrine  Anesthetic total: 5 ml  Complexity: complex Blunt dissection to break up loculations  Drainage: purulent  Drainage amount: moderate,  Less than 5 cc, mixed with blood  Packing material: 1/4 in iodoform gauze  Patient tolerance: Patient tolerated the procedure well with no immediate  complications.     Medications Ordered in ED Medications  dextrose 5 %-0.45 % sodium chloride infusion (not administered)  insulin regular (NOVOLIN R,HUMULIN R) 250 Units in sodium chloride 0.9 % 250 mL (1 Units/mL) infusion (4 Units/hr Intravenous New Bag/Given 06/15/16 1615)  lidocaine (PF) (XYLOCAINE) 1 % injection 5 mL (5 mLs Other Given by Other 06/15/16 1400)  sodium chloride 0.9 % bolus 1,000 mL (0 mLs Intravenous Stopped 06/15/16 1553)  insulin aspart (novoLOG) injection 8 Units (8 Units Intravenous Given 06/15/16 1438)     Initial Impression / Assessment and Plan / ED Course  I have reviewed the triage vital signs and the nursing notes.  Pertinent labs & imaging results that were available during my care of the  patient were reviewed by me and considered in my medical decision making (see chart for details).     Pt in dka, glucomander instituted.  Discussed with patient who was very reluctant to stay.  She does not seem to understand the severity of her illness and her need for compliance and better glucose control.  It is unclear if the dka is playing a role in cognition at this point.  She is AOx3.  . She is somewhat in denial about her illness, but is willing to stay. Discussed with Dr. Hyacinth MeekerMiller who also saw pt.    CRITICAL CARE Performed by: Burgess AmorIDOL, Rollande Thursby Total critical care time: 40 minutes Critical care time was exclusive of separately billable procedures and treating other patients. Critical care was necessary to treat or prevent imminent or life-threatening deterioration. Critical care was time spent personally by me on the following activities: development of treatment plan with patient and/or surrogate as well as nursing, discussions with consultants, evaluation of patient's response to treatment, examination of patient, obtaining history from patient or surrogate, ordering and performing treatments and interventions, ordering and review of laboratory studies, ordering and  review of radiographic studies, pulse oximetry and re-evaluation of patient's condition.   Final Clinical Impressions(s) / ED Diagnoses   Final diagnoses:  Diabetic ketoacidosis without coma associated with type 2 diabetes mellitus (HCC)  Perirectal abscess    New Prescriptions New Prescriptions   No medications on file     Burgess AmorJulie Bliss Behnke, Cordelia Poche-C 06/15/16 1700    Eber HongBrian Miller, MD 06/21/16 1553

## 2016-06-15 NOTE — ED Notes (Signed)
Pt requesting to have privacy and for no one to discuss her care with anyone outside her medical team at the hospital. Pt requesting to be a XXX patient. Registration notified.

## 2016-06-16 DIAGNOSIS — E1010 Type 1 diabetes mellitus with ketoacidosis without coma: Secondary | ICD-10-CM

## 2016-06-16 LAB — CBC
HEMATOCRIT: 33.2 % — AB (ref 36.0–46.0)
HEMOGLOBIN: 10.9 g/dL — AB (ref 12.0–15.0)
MCH: 26.8 pg (ref 26.0–34.0)
MCHC: 32.8 g/dL (ref 30.0–36.0)
MCV: 81.8 fL (ref 78.0–100.0)
Platelets: 260 10*3/uL (ref 150–400)
RBC: 4.06 MIL/uL (ref 3.87–5.11)
RDW: 13.5 % (ref 11.5–15.5)
WBC: 12.5 10*3/uL — ABNORMAL HIGH (ref 4.0–10.5)

## 2016-06-16 LAB — GLUCOSE, CAPILLARY
GLUCOSE-CAPILLARY: 191 mg/dL — AB (ref 65–99)
GLUCOSE-CAPILLARY: 215 mg/dL — AB (ref 65–99)
GLUCOSE-CAPILLARY: 204 mg/dL — AB (ref 65–99)
GLUCOSE-CAPILLARY: 128 mg/dL — AB (ref 65–99)
GLUCOSE-CAPILLARY: 208 mg/dL — AB (ref 65–99)
GLUCOSE-CAPILLARY: 109 mg/dL — AB (ref 65–99)
Glucose-Capillary: 109 mg/dL — ABNORMAL HIGH (ref 65–99)
Glucose-Capillary: 129 mg/dL — ABNORMAL HIGH (ref 65–99)
Glucose-Capillary: 135 mg/dL — ABNORMAL HIGH (ref 65–99)
Glucose-Capillary: 177 mg/dL — ABNORMAL HIGH (ref 65–99)
Glucose-Capillary: 224 mg/dL — ABNORMAL HIGH (ref 65–99)
Glucose-Capillary: 300 mg/dL — ABNORMAL HIGH (ref 65–99)
Glucose-Capillary: 339 mg/dL — ABNORMAL HIGH (ref 65–99)
Glucose-Capillary: 404 mg/dL — ABNORMAL HIGH (ref 65–99)

## 2016-06-16 LAB — BASIC METABOLIC PANEL
ANION GAP: 5 (ref 5–15)
Anion gap: 9 (ref 5–15)
Anion gap: 7 (ref 5–15)
BUN: 11 mg/dL (ref 6–20)
BUN: 12 mg/dL (ref 6–20)
BUN: 12 mg/dL (ref 6–20)
CALCIUM: 8.5 mg/dL — AB (ref 8.9–10.3)
CALCIUM: 8.4 mg/dL — AB (ref 8.9–10.3)
CO2: 19 mmol/L — ABNORMAL LOW (ref 22–32)
CO2: 23 mmol/L (ref 22–32)
CO2: 20 mmol/L — ABNORMAL LOW (ref 22–32)
CREATININE: 0.73 mg/dL (ref 0.44–1.00)
Calcium: 8.4 mg/dL — ABNORMAL LOW (ref 8.9–10.3)
Chloride: 110 mmol/L (ref 101–111)
Chloride: 110 mmol/L (ref 101–111)
Chloride: 109 mmol/L (ref 101–111)
Creatinine, Ser: 0.79 mg/dL (ref 0.44–1.00)
Creatinine, Ser: 0.7 mg/dL (ref 0.44–1.00)
GFR calc Af Amer: >60 mL/min (ref >60)
GLUCOSE: 228 mg/dL — AB (ref 65–99)
GLUCOSE: 115 mg/dL — AB (ref 65–99)
GLUCOSE: 255 mg/dL — AB (ref 65–99)
POTASSIUM: 4.1 mmol/L (ref 3.5–5.1)
POTASSIUM: 3.7 mmol/L (ref 3.5–5.1)
POTASSIUM: 3.9 mmol/L (ref 3.5–5.1)
SODIUM: 138 mmol/L (ref 135–145)
SODIUM: 138 mmol/L (ref 135–145)
Sodium: 136 mmol/L (ref 135–145)

## 2016-06-16 MED ORDER — ADULT MULTIVITAMIN W/MINERALS CH
1.0000 | ORAL_TABLET | Freq: Every day | ORAL | Status: DC
  Administered 2016-06-16 – 2016-06-18 (×3): 1 via ORAL
  Filled 2016-06-16 (×3): qty 1

## 2016-06-16 MED ORDER — GLUCERNA SHAKE PO LIQD
237.0000 mL | Freq: Two times a day (BID) | ORAL | Status: DC
  Administered 2016-06-16 – 2016-06-17 (×2): 237 mL via ORAL

## 2016-06-16 MED ORDER — INSULIN ASPART PROT & ASPART (70-30 MIX) 100 UNIT/ML ~~LOC~~ SUSP
10.0000 [IU] | Freq: Two times a day (BID) | SUBCUTANEOUS | Status: DC
  Filled 2016-06-16: qty 10

## 2016-06-16 MED ORDER — INSULIN GLARGINE 100 UNIT/ML ~~LOC~~ SOLN
10.0000 [IU] | SUBCUTANEOUS | Status: AC
  Administered 2016-06-16: 10 [IU] via SUBCUTANEOUS
  Filled 2016-06-16: qty 0.1

## 2016-06-16 NOTE — Progress Notes (Signed)
PROGRESS NOTE    Kellie Pearson  WUJ:[id number] DOB: 1966/02/15 DOA: 06/15/2016 PCP: Leo Grosser, MD     Brief Narrative:  Kellie Pearson admitted from home on 2/23 and found to be in DKA. Her management is complicated by relative homelessness and difficulty affording medications. Also found to have a perirectal abscess.   Assessment & Plan:   Active Problems:   Skin abscess   DKA (diabetic ketoacidoses) (HCC)   Hyponatremia   Diabetes mellitus with complication (HCC)   Peripheral neuropathy (HCC)   Homelessness   Leukocytosis   DKA -Anion gap has closed and bicarbonate is 23, will transition off IV insulin. Given difficulty affording medications will place on 7030. -We'll receive instruction on insulin administration and diet prior to discharge.  Perirectal abscess -Status post incision and drainage by EDP, packed. -Continue clindamycin. -Will request wound care consultation.  Social issues -Patient is currently living with her parents but states she cannot stay there very long, also has issues affording her medications. -Social work and case management consultations have been requested.   DVT prophylaxis: Lovenox Code Status: Full code Family Communication: Patient only Disposition Plan: Likely home in 24-48 hours  Consultants:   None  Procedures:   None  Antimicrobials:  Anti-infectives    Start     Dose/Rate Route Frequency Ordered Stop   06/15/16 2000  clindamycin (CLEOCIN) IVPB 600 mg     600 mg 100 mL/hr over 30 Minutes Intravenous Every 8 hours 06/15/16 1827         Subjective: Feels better, no complaints,  Objective: Vitals:   06/16/16 1400 06/16/16 1500 06/16/16 1522 06/16/16 1554  BP:   112/68   Pulse: 96 94 94 100  Resp: 19 13 20 15   Temp:    98.7 F (37.1 C)  TempSrc:    Oral  SpO2: 100% 100% 100% 100%  Weight:      Height:        Intake/Output Summary (Last 24 hours) at 06/16/16 1700 Last data filed at 06/16/16  0600  Gross per 24 hour  Intake          1327.63 ml  Output                0 ml  Net          1327.63 ml   Filed Weights   06/15/16 1333 06/16/16 0400  Weight: 72.6 kg (160 lb) 71 kg (156 lb 8.4 oz)    Examination:  General exam: Alert, awake, oriented x 3 Respiratory system: Clear to auscultation. Respiratory effort normal. Cardiovascular system:RRR. No murmurs, rubs, gallops. Gastrointestinal system: Abdomen is nondistended, soft and nontender. No organomegaly or masses felt. Normal bowel sounds heard. Central nervous system: Alert and oriented. No focal neurological deficits. Extremities: No C/C/E, +pedal pulses Skin: No rashes, lesions or ulcers Psychiatry: Judgement and insight appear normal. Mood & affect appropriate.     Data Reviewed: I have personally reviewed following labs and imaging studies  CBC:  Recent Labs Lab 06/15/16 1432 06/16/16 0744  WBC 13.5* 12.5*  NEUTROABS 10.9*  --   HGB 12.0 10.9*  HCT 36.5 33.2*  MCV 82.8 81.8  PLT 292 260   Basic Metabolic Panel:  Recent Labs Lab 06/15/16 1924 06/15/16 2235 06/16/16 0228 06/16/16 0744 06/16/16 0956  NA 137 138 138 138 136  K 3.6 3.8 4.1 3.7 3.9  CL 106 109 110 110 109  CO2 20* 22 19* 23 20*  GLUCOSE 307* 129* 228*  115* 255*  BUN 9 9 11 12 12   CREATININE 0.79 0.56 0.79 0.70 0.73  CALCIUM 8.9 8.6* 8.5* 8.4* 8.4*   GFR: Estimated Creatinine Clearance: 83.6 mL/min (by C-G formula based on SCr of 0.73 mg/dL). Liver Function Tests: No results for input(s): AST, ALT, ALKPHOS, BILITOT, PROT, ALBUMIN in the last 168 hours. No results for input(s): LIPASE, AMYLASE in the last 168 hours. No results for input(s): AMMONIA in the last 168 hours. Coagulation Profile: No results for input(s): INR, PROTIME in the last 168 hours. Cardiac Enzymes: No results for input(s): CKTOTAL, CKMB, CKMBINDEX, TROPONINI in the last 168 hours. BNP (last 3 results) No results for input(s): PROBNP in the last 8760  hours. HbA1C: No results for input(s): HGBA1C in the last 72 hours. CBG:  Recent Labs Lab 06/16/16 0756 06/16/16 0911 06/16/16 1032 06/16/16 1146 06/16/16 1350  GLUCAP 109* 300* 208* 109* 135*   Lipid Profile: No results for input(s): CHOL, HDL, LDLCALC, TRIG, CHOLHDL, LDLDIRECT in the last 72 hours. Thyroid Function Tests: No results for input(s): TSH, T4TOTAL, FREET4, T3FREE, THYROIDAB in the last 72 hours. Anemia Panel: No results for input(s): VITAMINB12, FOLATE, FERRITIN, TIBC, IRON, RETICCTPCT in the last 72 hours. Urine analysis:    Component Value Date/Time   COLORURINE STRAW (A) 06/15/2016 1615   APPEARANCEUR CLEAR 06/15/2016 1615   LABSPEC 1.020 06/15/2016 1615   PHURINE 5.0 06/15/2016 1615   GLUCOSEU >=500 (A) 06/15/2016 1615   HGBUR SMALL (A) 06/15/2016 1615   BILIRUBINUR NEGATIVE 06/15/2016 1615   KETONESUR 80 (A) 06/15/2016 1615   PROTEINUR NEGATIVE 06/15/2016 1615   UROBILINOGEN 0.2 09/02/2012 0047   NITRITE NEGATIVE 06/15/2016 1615   LEUKOCYTESUR NEGATIVE 06/15/2016 1615   Sepsis Labs: @LABRCNTIP (procalcitonin:4,lacticidven:4)  ) Recent Results (from the past 240 hour(s))  MRSA PCR Screening     Status: None   Collection Time: 06/15/16  7:04 PM  Result Value Ref Range Status   MRSA by PCR NEGATIVE NEGATIVE Final    Comment:        The GeneXpert MRSA Assay (FDA approved for NASAL specimens only), is one component of a comprehensive MRSA colonization surveillance program. It is not intended to diagnose MRSA infection nor to guide or monitor treatment for MRSA infections.   Culture, blood (routine x 2)     Status: None (Preliminary result)   Collection Time: 06/15/16  7:24 PM  Result Value Ref Range Status   Specimen Description BLOOD  Final   Special Requests NONE  Final   Culture NO GROWTH < 24 HOURS  Final   Report Status PENDING  Incomplete  Culture, blood (routine x 2)     Status: None (Preliminary result)   Collection Time: 06/15/16   7:32 PM  Result Value Ref Range Status   Specimen Description BLOOD  Final   Special Requests NONE  Final   Culture NO GROWTH < 24 HOURS  Final   Report Status PENDING  Incomplete         Radiology Studies: No results found.      Scheduled Meds: . clindamycin (CLEOCIN) IV  600 mg Intravenous Q8H  . enoxaparin (LOVENOX) injection  40 mg Subcutaneous Q24H  . feeding supplement (GLUCERNA SHAKE)  237 mL Oral BID BM  . gabapentin  100 mg Oral TID  . insulin aspart protamine- aspart  10 Units Subcutaneous BID WC  . multivitamin with minerals  1 tablet Oral Daily  . potassium chloride  40 mEq Oral BID   Continuous Infusions: .  sodium chloride Stopped (06/15/16 2030)     LOS: 1 day    Time spent: 25 minutes. Greater than 50% of this time was spent in direct contact with the patient coordinating care.     Chaya Jan, MD [facility] Pager [phone]  If 7PM-7AM, please contact night-coverage [email or web address] Password [email or web address] 06/16/2016, 5:00 PM

## 2016-06-16 NOTE — Progress Notes (Signed)
Initial Nutrition Assessment  DOCUMENTATION CODES:  Not applicable  INTERVENTION:  Once diet advanced, Glucerna Shake po BID, each supplement provides 220 kcal and 10 grams of protein  MVI with minerals  RD to follow up next week to attempt to educate   NUTRITION DIAGNOSIS:  Altered nutrition lab value related to Diabetes and inability to afford education as evidenced by DKA and BG >600 on admit.  GOAL:  Patient will meet greater than or equal to 90% of their needs  MONITOR:  PO intake, Supplement acceptance, Diet advancement, Labs, openess to education  REASON FOR ASSESSMENT:  Malnutrition Screening Tool    + Diet education  ASSESSMENT:  51 y/o female PMHx homelessness, Diabetes, hidradenitis. Presented with poor appetite and perirectal pain. Worked up for DKA  RD operating remotely. Per notes, unfortunately, the patient is homeless and has to choose between her medication or being homeless. She reports a loss of appetite. Unclear how patient was obtaining her food/fluid.   Apparently everytime she tries to eat, she becomes "uninterested in food".   Wt history would suggest she has lost ~ 10 lbs in the last 3 months, however her weight fluctuates and she was 155 lbs in august.   If still admitted next week, the RD can assess patients diet and offer suggestions, however it sounds as though the pt is "in denial" regarding her illness and wants to discharge quickly.   NFPE: Unable to conduct  Labs: BG, highly variable: 100-300, WBC: 12.5 Medications: Insulin, D5 IVF, KCL, IV abx   Recent Labs Lab 06/16/16 0228 06/16/16 0744 06/16/16 0956  NA 138 138 136  K 4.1 3.7 3.9  CL 110 110 109  CO2 19* 23 20*  BUN 11 12 12   CREATININE 0.79 0.70 0.73  CALCIUM 8.5* 8.4* 8.4*  GLUCOSE 228* 115* 255*    Diet Order:  Diet Carb Modified Fluid consistency: Thin; Room service appropriate? Yes  Skin:  Open abscess to Left buttocks  Last BM:  Unknown  Height:  Ht Readings  from Last 1 Encounters:  06/15/16 5\' 7"  (1.702 m)   Weight:  Wt Readings from Last 1 Encounters:  06/16/16 156 lb 8.4 oz (71 kg)   Wt Readings from Last 10 Encounters:  06/16/16 156 lb 8.4 oz (71 kg)  02/26/16 165 lb (74.8 kg)  02/25/16 165 lb (74.8 kg)  12/01/15 155 lb (70.3 kg)  06/06/15 160 lb (72.6 kg)  01/05/14 172 lb (78 kg)  12/31/13 165 lb (74.8 kg)  08/10/13 165 lb (74.8 kg)  07/22/13 165 lb (74.8 kg)  09/25/12 172 lb (78 kg)   Ideal Body Weight:  61.36 kg  BMI:  Body mass index is 24.52 kg/m.  Estimated Nutritional Needs:  Kcal:  1800-2000 kcals (25-28 kcal/kg bw) Protein:  70-85g Pro (1-1.2 g/kg bw) Fluid:  1.8-2 L fluid (1 ml/kcal)  EDUCATION NEEDS:  Education needs no appropriate at this time  Christophe LouisNathan Tajee Savant RD, LDN, CNSC Clinical Nutrition Pager: [phone] 06/16/2016 11:43 AM

## 2016-06-17 LAB — BASIC METABOLIC PANEL
Anion gap: 7 (ref 5–15)
BUN: 12 mg/dL (ref 6–20)
CALCIUM: 8.2 mg/dL — AB (ref 8.9–10.3)
CHLORIDE: 110 mmol/L (ref 101–111)
CO2: 21 mmol/L — AB (ref 22–32)
CREATININE: 0.69 mg/dL (ref 0.44–1.00)
GFR calc Af Amer: >60 mL/min (ref >60)
GFR calc non Af Amer: >60 mL/min (ref >60)
GLUCOSE: 304 mg/dL — AB (ref 65–99)
Potassium: 4.2 mmol/L (ref 3.5–5.1)
Sodium: 138 mmol/L (ref 135–145)

## 2016-06-17 LAB — HIV ANTIBODY (ROUTINE TESTING W REFLEX): HIV SCREEN 4TH GENERATION: NONREACTIVE

## 2016-06-17 LAB — CBC
HCT: 33.3 % — ABNORMAL LOW (ref 36.0–46.0)
HEMOGLOBIN: 11 g/dL — AB (ref 12.0–15.0)
MCH: 27.1 pg (ref 26.0–34.0)
MCHC: 33 g/dL (ref 30.0–36.0)
MCV: 82 fL (ref 78.0–100.0)
Platelets: 289 10*3/uL (ref 150–400)
RBC: 4.06 MIL/uL (ref 3.87–5.11)
RDW: 13.6 % (ref 11.5–15.5)
WBC: 9.9 10*3/uL (ref 4.0–10.5)

## 2016-06-17 LAB — GLUCOSE, CAPILLARY
GLUCOSE-CAPILLARY: 408 mg/dL — AB (ref 65–99)
GLUCOSE-CAPILLARY: 205 mg/dL — AB (ref 65–99)
Glucose-Capillary: 270 mg/dL — ABNORMAL HIGH (ref 65–99)

## 2016-06-17 MED ORDER — INSULIN ASPART PROT & ASPART (70-30 MIX) 100 UNIT/ML ~~LOC~~ SUSP
20.0000 [IU] | Freq: Two times a day (BID) | SUBCUTANEOUS | Status: DC
  Administered 2016-06-17 – 2016-06-18 (×2): 20 [IU] via SUBCUTANEOUS
  Filled 2016-06-17: qty 10

## 2016-06-17 NOTE — Consult Note (Signed)
WOC Nurse wound consult note Reason for Consult:perirectal abscess located at the left lower buttock Wound type:infectious Pressure Injury POA: No Measurement:0,5cm x 0.2cm x 0.2cm depth.  Patient could not tolerate further exploration of depth due to pain. Wound bed:red, moist. Small pustule noted at edge of I&D site Drainage (amount, consistency, odor) none Periwound:mild induration, erythema Dressing procedure/placement/frequency: There is insufficient depth to pack wound at this time, so we will rely on systemic antibiotics and use adjunctive warm compresses applied every 4 hours to resolve collection of purulent material. These will be followed by saline moist to dry dressings which should also draw the exudate outward. WOC nursing team will not follow, but will remain available to this patient, the nursing and medical teams.  Please re-consult if needed. Thanks, Ladona MowLaurie Jared Whorley, MSN, RN, GNP, Hans EdenCWOCN, CWON-[facility], FAAN  Pager# [phone]

## 2016-06-17 NOTE — Progress Notes (Signed)
PROGRESS NOTE    Kellie Pearson  ZOX:[id number] DOB: 03-04-1966 DOA: 06/15/2016 PCP: Leo Grosser, MD     Brief Narrative:  Kellie Pearson admitted from home on 2/23 and found to be in DKA. Her management is complicated by relative homelessness and difficulty affording medications. Also found to have a perirectal abscess.   Assessment & Plan:   Active Problems:   Skin abscess   DKA (diabetic ketoacidoses) (HCC)   Hyponatremia   Diabetes mellitus with complication (HCC)   Peripheral neuropathy (HCC)   Homelessness   Leukocytosis   DKA -CBGs remain elevated. -Will continue to up-titrate 70/30 as she cannot afford lantus/novolog.  Perirectal abscess -Status post incision and drainage by EDP, packed. -Continue clindamycin. -Will request wound care consultation.  Social issues -Patient is currently living with her parents but states she cannot stay there very long, also has issues affording her medications. -Social work and case management consultations have been requested.   DVT prophylaxis: Lovenox Code Status: Full code Family Communication: Patient only Disposition Plan: Likely home in 24-48 hours  Consultants:   None  Procedures:   None  Antimicrobials:  Anti-infectives    Start     Dose/Rate Route Frequency Ordered Stop   06/15/16 2000  clindamycin (CLEOCIN) IVPB 600 mg     600 mg 100 mL/hr over 30 Minutes Intravenous Every 8 hours 06/15/16 1827         Subjective: Feels better, no complaints,  Objective: Vitals:   06/17/16 0400 06/17/16 0500 06/17/16 0800 06/17/16 1000  BP:   93/71 (!) 91/52  Pulse:   91 99  Resp:   16 19  Temp: 99.1 F (37.3 C)  98.7 F (37.1 C)   TempSrc:  Oral Oral   SpO2:   96% 99%  Weight:  72.3 kg (159 lb 6.3 oz)    Height:        Intake/Output Summary (Last 24 hours) at 06/17/16 1712 Last data filed at 06/17/16 0600  Gross per 24 hour  Intake              200 ml  Output             1825 ml  Net             -1625 ml   Filed Weights   06/15/16 1333 06/16/16 0400 06/17/16 0500  Weight: 72.6 kg (160 lb) 71 kg (156 lb 8.4 oz) 72.3 kg (159 lb 6.3 oz)    Examination:  General exam: Alert, awake, oriented x 3 Respiratory system: Clear to auscultation. Respiratory effort normal. Cardiovascular system:RRR. No murmurs, rubs, gallops. Gastrointestinal system: Abdomen is nondistended, soft and nontender. No organomegaly or masses felt. Normal bowel sounds heard. Central nervous system: Alert and oriented. No focal neurological deficits. Extremities: No C/C/E, +pedal pulses Skin: No rashes, lesions or ulcers Psychiatry: Judgement and insight appear normal. Mood & affect appropriate.     Data Reviewed: I have personally reviewed following labs and imaging studies  CBC:  Recent Labs Lab 06/15/16 1432 06/16/16 0744 06/17/16 0525  WBC 13.5* 12.5* 9.9  NEUTROABS 10.9*  --   --   HGB 12.0 10.9* 11.0*  HCT 36.5 33.2* 33.3*  MCV 82.8 81.8 82.0  PLT 292 260 289   Basic Metabolic Panel:  Recent Labs Lab 06/15/16 2235 06/16/16 0228 06/16/16 0744 06/16/16 0956 06/17/16 0525  NA 138 138 138 136 138  K 3.8 4.1 3.7 3.9 4.2  CL 109 110 110 109 110  CO2 22 19* 23 20* 21*  GLUCOSE 129* 228* 115* 255* 304*  BUN 9 11 12 12 12   CREATININE 0.56 0.79 0.70 0.73 0.69  CALCIUM 8.6* 8.5* 8.4* 8.4* 8.2*   GFR: Estimated Creatinine Clearance: 83.6 mL/min (by C-G formula based on SCr of 0.69 mg/dL). Liver Function Tests: No results for input(s): AST, ALT, ALKPHOS, BILITOT, PROT, ALBUMIN in the last 168 hours. No results for input(s): LIPASE, AMYLASE in the last 168 hours. No results for input(s): AMMONIA in the last 168 hours. Coagulation Profile: No results for input(s): INR, PROTIME in the last 168 hours. Cardiac Enzymes: No results for input(s): CKTOTAL, CKMB, CKMBINDEX, TROPONINI in the last 168 hours. BNP (last 3 results) No results for input(s): PROBNP in the last 8760 hours. HbA1C: No  results for input(s): HGBA1C in the last 72 hours. CBG:  Recent Labs Lab 06/16/16 1350 06/16/16 1808 06/16/16 2135 06/17/16 1133 06/17/16 1645  GLUCAP 135* 404* 339* 408* 270*   Lipid Profile: No results for input(s): CHOL, HDL, LDLCALC, TRIG, CHOLHDL, LDLDIRECT in the last 72 hours. Thyroid Function Tests: No results for input(s): TSH, T4TOTAL, FREET4, T3FREE, THYROIDAB in the last 72 hours. Anemia Panel: No results for input(s): VITAMINB12, FOLATE, FERRITIN, TIBC, IRON, RETICCTPCT in the last 72 hours. Urine analysis:    Component Value Date/Time   COLORURINE STRAW (A) 06/15/2016 1615   APPEARANCEUR CLEAR 06/15/2016 1615   LABSPEC 1.020 06/15/2016 1615   PHURINE 5.0 06/15/2016 1615   GLUCOSEU >=500 (A) 06/15/2016 1615   HGBUR SMALL (A) 06/15/2016 1615   BILIRUBINUR NEGATIVE 06/15/2016 1615   KETONESUR 80 (A) 06/15/2016 1615   PROTEINUR NEGATIVE 06/15/2016 1615   UROBILINOGEN 0.2 09/02/2012 0047   NITRITE NEGATIVE 06/15/2016 1615   LEUKOCYTESUR NEGATIVE 06/15/2016 1615   Sepsis Labs: @LABRCNTIP (procalcitonin:4,lacticidven:4)  ) Recent Results (from the past 240 hour(s))  MRSA PCR Screening     Status: None   Collection Time: 06/15/16  7:04 PM  Result Value Ref Range Status   MRSA by PCR NEGATIVE NEGATIVE Final    Comment:        The GeneXpert MRSA Assay (FDA approved for NASAL specimens only), is one component of a comprehensive MRSA colonization surveillance program. It is not intended to diagnose MRSA infection nor to guide or monitor treatment for MRSA infections.   Culture, blood (routine x 2)     Status: None (Preliminary result)   Collection Time: 06/15/16  7:24 PM  Result Value Ref Range Status   Specimen Description BLOOD  Final   Special Requests NONE  Final   Culture NO GROWTH < 24 HOURS  Final   Report Status PENDING  Incomplete  Culture, blood (routine x 2)     Status: None (Preliminary result)   Collection Time: 06/15/16  7:32 PM  Result  Value Ref Range Status   Specimen Description BLOOD  Final   Special Requests NONE  Final   Culture NO GROWTH < 24 HOURS  Final   Report Status PENDING  Incomplete         Radiology Studies: No results found.      Scheduled Meds: . clindamycin (CLEOCIN) IV  600 mg Intravenous Q8H  . enoxaparin (LOVENOX) injection  40 mg Subcutaneous Q24H  . feeding supplement (GLUCERNA SHAKE)  237 mL Oral BID BM  . gabapentin  100 mg Oral TID  . insulin aspart protamine- aspart  20 Units Subcutaneous BID WC  . multivitamin with minerals  1 tablet Oral Daily  .  potassium chloride  40 mEq Oral BID   Continuous Infusions: . sodium chloride Stopped (06/15/16 2030)     LOS: 2 days    Time spent: 25 minutes. Greater than 50% of this time was spent in direct contact with the patient coordinating care.     Chaya Jan, MD [facility] Pager [phone]  If 7PM-7AM, please contact night-coverage [email or web address] Password [facility] 06/17/2016, 5:12 PM

## 2016-06-17 NOTE — Progress Notes (Addendum)
Inpatient Diabetes Program Recommendations  AACE/ADA: New Consensus Statement on Inpatient Glycemic Control (2015)  Target Ranges:  Prepandial:   less than 140 mg/dL      Peak postprandial:   less than 180 mg/dL (1-2 hours)      Critically ill patients:  140 - 180 mg/dL   Lab Results  Component Value Date   GLUCAP 339 (H) 06/16/2016   HGBA1C 15.4 (H) 01/05/2014    Review of Glycemic Control Results for Kellie Pearson, Kellie Pearson (MRN [id number]) as of 06/17/2016 08:53  Ref. Range 06/16/2016 10:32 06/16/2016 11:46 06/16/2016 13:50 06/16/2016 18:08 06/16/2016 21:35  Glucose-Capillary Latest Ref Range: 65 - 99 mg/dL 409208 (H) 811109 (H) 914135 (H) 404 (H) 339 (H)   Diabetes history: DM  Outpatient Diabetes medications: 70/30 10-20 units bid (note states not taking) Current orders for Inpatient glycemic control: 70/30 10 units bid  Inpatient Diabetes Program Recommendations:  Noted CBGs remain elevated and has received 31 units insulin over the past 24 hrs. Please consider: Increase 70/30 insulin to 15 units bid Add Novolog correction 0-9 units tid with meals and 0-5 units hs with adjustment as needed. Text page to Dr. Ardyth HarpsHernandez.Will follow.  Thank you, Billy FischerJudy E. Taj Nevins, RN, MSN, CDE Inpatient Glycemic Control Team Team Pager [phone] (8am-5pm) 06/17/2016 9:00 AM   T

## 2016-06-17 NOTE — Plan of Care (Signed)
Problem: Education: Goal: Knowledge of  General Education information/materials will improve Outcome: Progressing Patient verbalized understanding about nutrition and the need for Glucerna shakes to make sure that she is getting adequate intake.

## 2016-06-17 NOTE — Progress Notes (Signed)
Notified Dr. Ardyth HarpsHernandez of patient blood glucose of 408. Patient just finished Glucerna shake about 15 minutes prior to drinking shake.

## 2016-06-18 LAB — GLUCOSE, CAPILLARY
GLUCOSE-CAPILLARY: 297 mg/dL — AB (ref 65–99)
GLUCOSE-CAPILLARY: 154 mg/dL — AB (ref 65–99)
Glucose-Capillary: 233 mg/dL — ABNORMAL HIGH (ref 65–99)

## 2016-06-18 MED ORDER — CLINDAMYCIN HCL 300 MG PO CAPS: 300.0000 mg | ORAL_CAPSULE | Freq: Three times a day (TID) | ORAL | 0 refills | Status: DC

## 2016-06-18 MED ORDER — ADULT MULTIVITAMIN W/MINERALS CH: 1.0000 | ORAL_TABLET | Freq: Every day | ORAL | Status: AC

## 2016-06-18 MED ORDER — INSULIN ASPART PROT & ASPART (70-30 MIX) 100 UNIT/ML ~~LOC~~ SUSP: 20.0000 [IU] | Freq: Two times a day (BID) | SUBCUTANEOUS | 11 refills | Status: DC

## 2016-06-18 NOTE — Discharge Summary (Signed)
Physician Discharge Summary  Kellie Pearson XXXLowe WUJ:[id number] DOB: 01/08/1966 DOA: 06/15/2016  PCP: Leo GrosserPICKARD,WARREN TOM, MD  Admit date: 06/15/2016 Discharge date: 06/18/2016  Time spent: 45 minutes  Recommendations for Outpatient Follow-up:  -To be discharged home today. -We close outpatient follow-up for management of her diabetes. -To complete a ten-day course of clindamycin for a perirectal abscess.   Discharge Diagnoses:  Active Problems:   Skin abscess   DKA (diabetic ketoacidoses) (HCC)   Hyponatremia   Diabetes mellitus with complication (HCC)   Peripheral neuropathy (HCC)   Homelessness   Leukocytosis   Discharge Condition: Stable and improved  Filed Weights   06/15/16 1333 06/16/16 0400 06/17/16 0500  Weight: 72.6 kg (160 lb) 71 kg (156 lb 8.4 oz) 72.3 kg (159 lb 6.3 oz)    History of present illness:  As per Dr. Konrad DoloresMerrell on 2/23: Kellie LimeNatasha Pearson XXXLowe is a 51 y.o. female with medical history significant of homelessness, diabetes, hidradenitis.  Patient very emphatically states that her only symptom prior to coming into the hospital was a poor appetite and perirectal pain. Patient states that she is homeless and unable to care for her medical conditions properly due to funding. Patient never takes her sugar and rarely takes her insulin. Patient states that all of her friends and family orally interested in taking from her and not helping her. Patient denies any discharge from perirectal area of tenderness. States the area has been swollen and red for about 5 days. Patient is try to keep the area very clean without improvement. Patient has not done anything to help resolve her appetite and states that every time she tries to eat she is uninterested in food. Denies any fevers, myalgias, confusion, abdominal pain, nausea, vomiting, diarrhea, dysuria, frequency, chest pain, palpitations, fatigue.  ED Course: Addictive findings outlined below. Perirectal abscess drained by PA in  ED with packing in place.  Hospital Course:   DKA -CBGs improved and will need close outpatient follow-up for insulin titration. -Continue insulin 7030 at 20 units twice daily for now.  Perirectal abscess -Status post incision and drainage by EDP. -Continue clindamycin.  Procedures:  None   Consultations:  None  Discharge Instructions  Discharge Instructions    Diet - low sodium heart healthy    Complete by:  As directed    Increase activity slowly    Complete by:  As directed      Allergies as of 06/18/2016   No Known Allergies     Medication List    STOP taking these medications   insulin NPH-regular Human (70-30) 100 UNIT/ML injection Commonly known as:  NOVOLIN 70/30 Replaced by:  insulin aspart protamine- aspart (70-30) 100 UNIT/ML injection     TAKE these medications   clindamycin 300 MG capsule Commonly known as:  CLEOCIN Take 1 capsule (300 mg total) by mouth 3 (three) times daily.   insulin aspart protamine- aspart (70-30) 100 UNIT/ML injection Commonly known as:  NOVOLOG MIX 70/30 Inject 0.2 mLs (20 Units total) into the skin 2 (two) times daily with a meal. Replaces:  insulin NPH-regular Human (70-30) 100 UNIT/ML injection   multivitamin with minerals Tabs tablet Take 1 tablet by mouth daily. Start taking on:  06/19/2016      No Known Allergies    The results of significant diagnostics from this hospitalization (including imaging, microbiology, ancillary and laboratory) are listed below for reference.    Significant Diagnostic Studies: No results found.  Microbiology: Recent Results (from the past  240 hour(s))  MRSA PCR Screening     Status: None   Collection Time: 06/15/16  7:04 PM  Result Value Ref Range Status   MRSA by PCR NEGATIVE NEGATIVE Final    Comment:        The GeneXpert MRSA Assay (FDA approved for NASAL specimens only), is one component of a comprehensive MRSA colonization surveillance program. It is not intended  to diagnose MRSA infection nor to guide or monitor treatment for MRSA infections.   Culture, blood (routine x 2)     Status: None (Preliminary result)   Collection Time: 06/15/16  7:24 PM  Result Value Ref Range Status   Specimen Description BLOOD  Final   Special Requests NONE  Final   Culture NO GROWTH 3 DAYS  Final   Report Status PENDING  Incomplete  Culture, blood (routine x 2)     Status: None (Preliminary result)   Collection Time: 06/15/16  7:32 PM  Result Value Ref Range Status   Specimen Description BLOOD  Final   Special Requests NONE  Final   Culture NO GROWTH 3 DAYS  Final   Report Status PENDING  Incomplete     Labs: Basic Metabolic Panel:  Recent Labs Lab 06/15/16 2235 06/16/16 0228 06/16/16 0744 06/16/16 0956 06/17/16 0525  NA 138 138 138 136 138  K 3.8 4.1 3.7 3.9 4.2  CL 109 110 110 109 110  CO2 22 19* 23 20* 21*  GLUCOSE 129* 228* 115* 255* 304*  BUN 9 11 12 12 12   CREATININE 0.56 0.79 0.70 0.73 0.69  CALCIUM 8.6* 8.5* 8.4* 8.4* 8.2*   Liver Function Tests: No results for input(s): AST, ALT, ALKPHOS, BILITOT, PROT, ALBUMIN in the last 168 hours. No results for input(s): LIPASE, AMYLASE in the last 168 hours. No results for input(s): AMMONIA in the last 168 hours. CBC:  Recent Labs Lab 06/15/16 1432 06/16/16 0744 06/17/16 0525  WBC 13.5* 12.5* 9.9  NEUTROABS 10.9*  --   --   HGB 12.0 10.9* 11.0*  HCT 36.5 33.2* 33.3*  MCV 82.8 81.8 82.0  PLT 292 260 289   Cardiac Enzymes: No results for input(s): CKTOTAL, CKMB, CKMBINDEX, TROPONINI in the last 168 hours. BNP: BNP (last 3 results) No results for input(s): BNP in the last 8760 hours.  ProBNP (last 3 results) No results for input(s): PROBNP in the last 8760 hours.  CBG:  Recent Labs Lab 06/17/16 1133 06/17/16 1645 06/17/16 2027 06/18/16 0732 06/18/16 1132  GLUCAP 408* 270* 205* 154* 233*       Signed:  Chaya Jan  [facility] Pager:  [phone] 06/18/2016, 5:57 PM

## 2016-06-18 NOTE — Progress Notes (Signed)
  RD consulted for nutrition education regarding diabetes.   Lab Results  Component Value Date   HGBA1C 15.4 (H) 01/05/2014  Follow A1C pending??  RD provided "Carbohydrate Counting for People with Diabetes" handout. Discussed different food groups and their effects on blood sugar, emphasizing carbohydrate-containing foods. Provided list of carbohydrates and recommended serving sizes of common foods.  Discussed importance of controlled and consistent carbohydrate intake throughout the day. Provided examples of ways to balance meals/snacks and encouraged intake of high-fiber, whole grain complex carbohydrates. Teach back method used.  Expect poor compliance given her belief that "The Shaune PollackLord will take care of this if He wants to".  Body mass index is 24.96 kg/m. Pt meets criteria for normal based on current BMI.  Current diet order is CHO Modified, patient is consuming approximately 75% of meals at this time. Labs and medications reviewed. No further nutrition interventions warranted at this time. RD contact information provided. If additional nutrition issues arise, please re-consult RD.  Royann ShiversLynn Kaj Vasil MS,RD,CSG,LDN Office: [phone] Pager: [phone]

## 2016-06-18 NOTE — Progress Notes (Signed)
Patient discharged with IV removed and site intact. Patient discharged with personal belongings and prescriptions.  

## 2016-06-18 NOTE — Care Management Note (Signed)
Case Management Note  Patient Details  Name: Kellie Pearson MRN: [id number] Date of Birth: 06/16/1965  Subjective/Objective:                  Pt admitted from home with DKA. She lives with her mother. CSW consulted for possible future homelessness. CM consult for med assistance. Pt has insurance, unsure if pt has part D coverage. Pt ordered 70/30 Novolin - $25/vial. MD gave Rx for glucose meter and supplies.   Action/Plan: Pt discharging home today. No CM assistance available.   Expected Discharge Date:  06/18/16               Expected Discharge Plan:  Home/Self Care  In-House Referral:  Clinical Social Work  Discharge planning Services  CM Consult  Post Acute Care Choice:  NA Choice offered to:  NA  Status of Service:  Completed, signed off  Malcolm MetroChildress, Channing Savich Demske, RN 06/18/2016, 2:32 PM

## 2016-06-18 NOTE — Clinical Social Work Note (Addendum)
Clinical Social Work Assessment  Patient Details  Name: Kellie Pearson MRN: [id number] Date of Birth: 06/02/1965  Date of referral:  06/18/16               Reason for consult:  Discharge Planning                Permission sought to share information with:    Permission granted to share information::     Name::        Agency::     Relationship::     Contact Information:     Housing/Transportation Living arrangements for the past 2 months:  Single Family Home Source of Information:  Patient Patient Interpreter Needed:  None Criminal Activity/Legal Involvement Pertinent to Current Situation/Hospitalization:    Significant Relationships:  Siblings, Parents Lives with:  Parents Do you feel safe going back to the place where you live?  Yes Need for family participation in patient care:  Yes (Comment)  Care giving concerns:  None identified at baseline.    Social Worker assessment / plan:  Patient stated that she has been living with her parents "on and off" since last February in their home. Patient stated that she has battled homelessness for years and stayed in both shelters and abandoned buildings. Patient stated that at times her parents will tell her it is their home and make her leave. She did not elaborate on what causes them to ask her to leave. Patient stated that she did not want to answer these questions because she did not want to get her parents in any trouble. LCSW provided patient with a shelter list. 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 LCSW encouraged patient to apply for income based housing at the local housing authority. LCSW provided information to patient about the local housing authority. LCSW signing off.   Employment status:  Disabled (Comment on whether or not currently receiving Disability) Insurance  information:  Medicare PT Recommendations:  Not assessed at this time Information / Referral to community resources:  Shelter  Patient/Family's Response to care:  Patient is agreeable to return home at discharge.  Patient/Family's Understanding of and Emotional Response to Diagnosis, Current Treatment, and Prognosis:  Patient understands her diagnosis, treatment and prognosis.   Emotional Assessment Appearance:  Appears stated age Attitude/Demeanor/Rapport:  Guarded, Suspicious Affect (typically observed):  Calm Orientation:  Oriented to Self, Oriented to Place, Oriented to  Time, Oriented to Situation Alcohol / Substance use:  Not Applicable Psych involvement (Current and /or in the community):  No (Comment)  Discharge Needs  Concerns to be addressed:  No discharge needs identified Readmission within the last 30 days:  Yes Current discharge risk:  None Barriers to Discharge:  No Barriers Identified   Annice NeedySettle, Solara Goodchild D, LCSW 06/18/2016, 11:04 AM

## 2016-06-18 NOTE — Care Management Important Message (Signed)
Important Message  Patient Details  Name: Kellie Pearson MRN: [id number] Date of Birth: 03/18/1966   Medicare Important Message Given:  Yes    Malcolm MetroChildress, Joby Hershkowitz Demske, RN 06/18/2016, 2:31 PM

## 2016-06-21 LAB — CULTURE, BLOOD (ROUTINE X 2)
CULTURE: NO GROWTH
CULTURE: NO GROWTH

## 2016-08-02 DIAGNOSIS — E119 Type 2 diabetes mellitus without complications: Secondary | ICD-10-CM | POA: Diagnosis not present

## 2016-08-02 DIAGNOSIS — H40033 Anatomical narrow angle, bilateral: Secondary | ICD-10-CM | POA: Diagnosis not present

## 2016-08-05 LAB — HM DIABETES EYE EXAM

## 2016-08-19 ENCOUNTER — Emergency Department (HOSPITAL_COMMUNITY): Payer: Medicare Other

## 2016-08-19 ENCOUNTER — Encounter (HOSPITAL_COMMUNITY): Payer: Self-pay | Admitting: Cardiology

## 2016-08-19 ENCOUNTER — Inpatient Hospital Stay (HOSPITAL_COMMUNITY)
Admission: EM | Admit: 2016-08-19 | Discharge: 2016-08-25 | DRG: 637 | Disposition: A | Discharge status: Home / Self Care | Payer: Medicare Other | Attending: Internal Medicine | Admitting: Internal Medicine

## 2016-08-19 DIAGNOSIS — E1110 Type 2 diabetes mellitus with ketoacidosis without coma: Secondary | ICD-10-CM | POA: Diagnosis present

## 2016-08-19 DIAGNOSIS — Z87891 Personal history of nicotine dependence: Secondary | ICD-10-CM

## 2016-08-19 DIAGNOSIS — E1010 Type 1 diabetes mellitus with ketoacidosis without coma: Secondary | ICD-10-CM | POA: Diagnosis not present

## 2016-08-19 DIAGNOSIS — B373 Candidiasis of vulva and vagina: Secondary | ICD-10-CM | POA: Diagnosis present

## 2016-08-19 DIAGNOSIS — F4489 Other dissociative and conversion disorders: Secondary | ICD-10-CM | POA: Diagnosis not present

## 2016-08-19 DIAGNOSIS — F22 Delusional disorders: Secondary | ICD-10-CM | POA: Diagnosis not present

## 2016-08-19 DIAGNOSIS — N390 Urinary tract infection, site not specified: Secondary | ICD-10-CM

## 2016-08-19 DIAGNOSIS — Z9114 Patient's other noncompliance with medication regimen: Secondary | ICD-10-CM

## 2016-08-19 DIAGNOSIS — E1040 Type 1 diabetes mellitus with diabetic neuropathy, unspecified: Secondary | ICD-10-CM | POA: Diagnosis present

## 2016-08-19 DIAGNOSIS — E1011 Type 1 diabetes mellitus with ketoacidosis with coma: Secondary | ICD-10-CM | POA: Diagnosis not present

## 2016-08-19 DIAGNOSIS — E118 Type 2 diabetes mellitus with unspecified complications: Secondary | ICD-10-CM | POA: Diagnosis present

## 2016-08-19 DIAGNOSIS — I6789 Other cerebrovascular disease: Secondary | ICD-10-CM | POA: Diagnosis not present

## 2016-08-19 DIAGNOSIS — R4182 Altered mental status, unspecified: Secondary | ICD-10-CM | POA: Diagnosis not present

## 2016-08-19 DIAGNOSIS — E872 Acidosis, unspecified: Secondary | ICD-10-CM | POA: Diagnosis present

## 2016-08-19 DIAGNOSIS — Z794 Long term (current) use of insulin: Secondary | ICD-10-CM | POA: Diagnosis not present

## 2016-08-19 DIAGNOSIS — G9341 Metabolic encephalopathy: Secondary | ICD-10-CM | POA: Diagnosis present

## 2016-08-19 DIAGNOSIS — E1065 Type 1 diabetes mellitus with hyperglycemia: Secondary | ICD-10-CM | POA: Diagnosis not present

## 2016-08-19 LAB — URINALYSIS, ROUTINE W REFLEX MICROSCOPIC
Bilirubin Urine: NEGATIVE
KETONES UR: 80 mg/dL — AB
NITRITE: NEGATIVE
PH: 5 (ref 5.0–8.0)
Protein, ur: 30 mg/dL — AB
Specific Gravity, Urine: 1.022 (ref 1.005–1.030)

## 2016-08-19 LAB — COMPREHENSIVE METABOLIC PANEL
ALBUMIN: 3.2 g/dL — AB (ref 3.5–5.0)
ALK PHOS: 169 U/L — AB (ref 38–126)
ALT: 11 U/L — ABNORMAL LOW (ref 14–54)
ANION GAP: 25 — AB (ref 5–15)
AST: 8 U/L — ABNORMAL LOW (ref 15–41)
BUN: 20 mg/dL (ref 6–20)
CALCIUM: 9.8 mg/dL (ref 8.9–10.3)
CHLORIDE: 98 mmol/L — AB (ref 101–111)
CO2: 12 mmol/L — AB (ref 22–32)
Creatinine, Ser: 1.32 mg/dL — ABNORMAL HIGH (ref 0.44–1.00)
GFR calc non Af Amer: 47 mL/min — ABNORMAL LOW (ref >60)
GFR, EST AFRICAN AMERICAN: 54 mL/min — AB (ref >60)
GLUCOSE: 590 mg/dL — AB (ref 65–99)
Potassium: 4.7 mmol/L (ref 3.5–5.1)
SODIUM: 135 mmol/L (ref 135–145)
Total Bilirubin: 1.5 mg/dL — ABNORMAL HIGH (ref 0.3–1.2)
Total Protein: 8.7 g/dL — ABNORMAL HIGH (ref 6.5–8.1)

## 2016-08-19 LAB — GLUCOSE, CAPILLARY
GLUCOSE-CAPILLARY: 337 mg/dL — AB (ref 65–99)
GLUCOSE-CAPILLARY: 234 mg/dL — AB (ref 65–99)
GLUCOSE-CAPILLARY: 179 mg/dL — AB (ref 65–99)
Glucose-Capillary: 303 mg/dL — ABNORMAL HIGH (ref 65–99)
Glucose-Capillary: 223 mg/dL — ABNORMAL HIGH (ref 65–99)
Glucose-Capillary: 247 mg/dL — ABNORMAL HIGH (ref 65–99)
Glucose-Capillary: 227 mg/dL — ABNORMAL HIGH (ref 65–99)
Glucose-Capillary: 154 mg/dL — ABNORMAL HIGH (ref 65–99)

## 2016-08-19 LAB — CBC WITH DIFFERENTIAL/PLATELET
Basophils Absolute: 0 10*3/uL (ref 0.0–0.1)
Basophils Relative: 0 %
Eosinophils Absolute: 0 10*3/uL (ref 0.0–0.7)
Eosinophils Relative: 0 %
HEMATOCRIT: 35 % — AB (ref 36.0–46.0)
HEMOGLOBIN: 11.4 g/dL — AB (ref 12.0–15.0)
LYMPHS ABS: 1.7 10*3/uL (ref 0.7–4.0)
LYMPHS PCT: 10 %
MCH: 26.2 pg (ref 26.0–34.0)
MCHC: 32.6 g/dL (ref 30.0–36.0)
MCV: 80.5 fL (ref 78.0–100.0)
MONO ABS: 0.7 10*3/uL (ref 0.1–1.0)
MONOS PCT: 4 %
NEUTROS ABS: 14.7 10*3/uL — AB (ref 1.7–7.7)
NEUTROS PCT: 86 %
Platelets: 430 10*3/uL — ABNORMAL HIGH (ref 150–400)
RBC: 4.35 MIL/uL (ref 3.87–5.11)
RDW: 13.9 % (ref 11.5–15.5)
WBC: 17.2 10*3/uL — ABNORMAL HIGH (ref 4.0–10.5)

## 2016-08-19 LAB — BASIC METABOLIC PANEL
ANION GAP: 12 (ref 5–15)
BUN: 14 mg/dL (ref 6–20)
CALCIUM: 8.8 mg/dL — AB (ref 8.9–10.3)
CO2: 20 mmol/L — AB (ref 22–32)
Chloride: 106 mmol/L (ref 101–111)
Creatinine, Ser: 1.02 mg/dL — ABNORMAL HIGH (ref 0.44–1.00)
GFR calc non Af Amer: >60 mL/min (ref >60)
Glucose, Bld: 269 mg/dL — ABNORMAL HIGH (ref 65–99)
Potassium: 3.8 mmol/L (ref 3.5–5.1)
SODIUM: 138 mmol/L (ref 135–145)

## 2016-08-19 LAB — BLOOD GAS, VENOUS
ACID-BASE DEFICIT: 16.5 mmol/L — AB (ref 0.0–2.0)
Bicarbonate: 11.8 mmol/L — ABNORMAL LOW (ref 20.0–28.0)
FIO2: 0.21
O2 SAT: 85.5 %
PCO2 VEN: 27.8 mmHg — AB (ref 44.0–60.0)
Patient temperature: 37
pH, Ven: 7.188 — CL (ref 7.250–7.430)
pO2, Ven: 62 mmHg — ABNORMAL HIGH (ref 32.0–45.0)

## 2016-08-19 LAB — CBG MONITORING, ED
GLUCOSE-CAPILLARY: 541 mg/dL — AB (ref 65–99)
Glucose-Capillary: 436 mg/dL — ABNORMAL HIGH (ref 65–99)

## 2016-08-19 LAB — MRSA PCR SCREENING: MRSA BY PCR: NEGATIVE

## 2016-08-19 LAB — AMMONIA: Ammonia: <9 umol/L — ABNORMAL LOW (ref 9–35)

## 2016-08-19 MED ORDER — SODIUM CHLORIDE 0.9 % IV SOLN
INTRAVENOUS | Status: DC
  Administered 2016-08-19: 5.3 [IU]/h via INTRAVENOUS
  Filled 2016-08-19: qty 2.5

## 2016-08-19 MED ORDER — SODIUM CHLORIDE 0.9 % IV BOLUS (SEPSIS)
1000.0000 mL | Freq: Once | INTRAVENOUS | Status: AC
  Administered 2016-08-19: 1000 mL via INTRAVENOUS

## 2016-08-19 MED ORDER — CEFTRIAXONE SODIUM 1 G IJ SOLR
INTRAMUSCULAR | Status: AC
  Filled 2016-08-19: qty 10

## 2016-08-19 MED ORDER — POTASSIUM CHLORIDE CRYS ER 20 MEQ PO TBCR
40.0000 meq | EXTENDED_RELEASE_TABLET | Freq: Once | ORAL | Status: AC
  Administered 2016-08-19: 40 meq via ORAL
  Filled 2016-08-19: qty 2

## 2016-08-19 MED ORDER — POTASSIUM CHLORIDE 10 MEQ/100ML IV SOLN
10.0000 meq | INTRAVENOUS | Status: AC
  Administered 2016-08-19 (×2): 10 meq via INTRAVENOUS
  Filled 2016-08-19 (×2): qty 100

## 2016-08-19 MED ORDER — SODIUM CHLORIDE 0.9 % IV SOLN
INTRAVENOUS | Status: DC
  Administered 2016-08-19: 2.8 [IU]/h via INTRAVENOUS
  Filled 2016-08-19: qty 2.5

## 2016-08-19 MED ORDER — SODIUM CHLORIDE 0.9 % IV SOLN
INTRAVENOUS | Status: DC
  Administered 2016-08-19: 16:00:00 via INTRAVENOUS

## 2016-08-19 MED ORDER — DEXTROSE-NACL 5-0.45 % IV SOLN: INTRAVENOUS | Status: DC

## 2016-08-19 MED ORDER — DEXTROSE-NACL 5-0.45 % IV SOLN
INTRAVENOUS | Status: DC
  Administered 2016-08-19: 18:00:00 via INTRAVENOUS

## 2016-08-19 MED ORDER — ENOXAPARIN SODIUM 30 MG/0.3ML ~~LOC~~ SOLN
30.0000 mg | SUBCUTANEOUS | Status: DC
  Administered 2016-08-19: 30 mg via SUBCUTANEOUS
  Filled 2016-08-19: qty 0.3

## 2016-08-19 MED ORDER — DEXTROSE 5 % IV SOLN
1.0000 g | INTRAVENOUS | Status: DC
  Administered 2016-08-19 – 2016-08-23 (×5): 1 g via INTRAVENOUS
  Filled 2016-08-19 (×8): qty 10

## 2016-08-19 MED ORDER — SODIUM CHLORIDE 0.9 % IV SOLN
INTRAVENOUS | Status: DC
  Administered 2016-08-19: 14:00:00 via INTRAVENOUS

## 2016-08-19 MED ORDER — ADULT MULTIVITAMIN W/MINERALS CH
1.0000 | ORAL_TABLET | Freq: Every day | ORAL | Status: DC
  Administered 2016-08-19 – 2016-08-25 (×7): 1 via ORAL
  Filled 2016-08-19 (×7): qty 1

## 2016-08-19 NOTE — ED Provider Notes (Signed)
[facility]-EMERGENCY DEPT Provider Note   CSN: [id number] Arrival date & time: 08/19/16  1159  By signing my name below, I, Sonum Patel, attest that this documentation has been prepared under the direction and in the presence of Benjiman Core, MD. Electronically Signed: Sonum Patel, Neurosurgeon. 08/19/16. 12:18 PM.  History   Chief Complaint Chief Complaint  Patient presents with  . Hyperglycemia  . Altered Mental Status    The history is provided by the patient and the EMS personnel. The history is limited by the condition of the patient. No language interpreter was used.    LEVEL 5 CAVEAT: AMS HPI Comments: Kellie Pearson is a 51 y.o. female brought in by ambulance, who presents to the Emergency Department with altered mental status and hyperglycemia today. EMS administered IV fluids after which her CBG was in the 500s. She has a history of DKA and noncompliance with diabetic medications. Per nursing note, family having IVC papers completed. She denies recent abscesses.    Past Medical History:  Diagnosis Date  . Abscess    admitted 5/14  . Diabetes mellitus without complication (HCC)   . Hidradenitis suppurativa     Patient Active Problem List   Diagnosis Date Noted  . Hyponatremia 06/15/2016  . Diabetes mellitus with complication (HCC) 06/15/2016  . Peripheral neuropathy 06/15/2016  . Homelessness 06/15/2016  . Leukocytosis 06/15/2016  . Diabetic ketoacidosis without coma associated with type 2 diabetes mellitus (HCC)   . Perirectal abscess   . Hidradenitis suppurativa   . Diabetes mellitus without complication (HCC)   . Increased anion gap metabolic acidosis 09/02/2012  . DKA (diabetic ketoacidoses) (HCC) 09/02/2012  . Metabolic acidosis 09/01/2012  . Tachycardia 09/01/2012  . Skin abscess 09/01/2012    Past Surgical History:  Procedure Laterality Date  . BACK SURGERY      OB History    Gravida Para Term Preterm AB Living   0 0 0 0 0 0   SAB TAB Ectopic Multiple  Live Births   0 0 0 0 0       Home Medications    Prior to Admission medications   Medication Sig Start Date End Date Taking? Authorizing Provider  clindamycin (CLEOCIN) 300 MG capsule Take 1 capsule (300 mg total) by mouth 3 (three) times daily. 06/18/16  Yes Henderson Cloud, MD  insulin aspart protamine- aspart (NOVOLOG MIX 70/30) (70-30) 100 UNIT/ML injection Inject 0.2 mLs (20 Units total) into the skin 2 (two) times daily with a meal. 06/18/16  Yes Henderson Cloud, MD  Multiple Vitamin (MULTIVITAMIN WITH MINERALS) TABS tablet Take 1 tablet by mouth daily. 06/19/16  Yes Estela Isaiah Blakes, MD    Family History Family History  Problem Relation Age of Onset  . Diabetes Mother   . Diabetes Father     Social History Social History  Substance Use Topics  . Smoking status: Former Games developer  . Smokeless tobacco: Never Used  . Alcohol use No     Allergies   Patient has no known allergies.   Review of Systems Review of Systems  Unable to perform ROS: Mental status change     Physical Exam Updated Vital Signs BP (!) 164/69   Pulse (!) 109   Temp 97.9 F (36.6 C) (Oral)   Resp 18   Ht  (1.702 m)   Wt 159 lb (72.1 kg)   LMP 07/26/2016   SpO2 100%   BMI 24.90 kg/m   Physical Exam  HENT:  Head: Normocephalic and atraumatic.  Mouth is dry   Eyes: Conjunctivae are normal. No scleral icterus.  Neck: Normal range of motion. No tracheal deviation present.  Cardiovascular: Regular rhythm and normal heart sounds.  Tachycardia present.   Pulmonary/Chest: Effort normal and breath sounds normal. No respiratory distress. She has no wheezes. She has no rales.  Abdominal: Soft. She exhibits no distension. There is no tenderness.  Musculoskeletal: She exhibits no edema or deformity.  No peripheral edema   Neurological: She is alert.  Face is symmetric. Awake and answers questions but seems slow to answer and confused.   Skin: Skin is warm and dry.   Nursing note and vitals reviewed.    ED Treatments / Results   Labs (all labs ordered are listed, but only abnormal results are displayed) Labs Reviewed  BLOOD GAS, VENOUS - Abnormal; Notable for the following:       Result Value   pH, Ven 7.188 (*)    pCO2, Ven 27.8 (*)    pO2, Ven 62.0 (*)    Bicarbonate 11.8 (*)    Acid-base deficit 16.5 (*)    All other components within normal limits  COMPREHENSIVE METABOLIC PANEL - Abnormal; Notable for the following:    Chloride 98 (*)    CO2 12 (*)    Glucose, Bld 590 (*)    Creatinine, Ser 1.32 (*)    Total Protein 8.7 (*)    Albumin 3.2 (*)    AST 8 (*)    ALT 11 (*)    Alkaline Phosphatase 169 (*)    Total Bilirubin 1.5 (*)    GFR calc non Af Amer 47 (*)    GFR calc Af Amer 54 (*)    Anion gap 25 (*)    All other components within normal limits  CBC WITH DIFFERENTIAL/PLATELET - Abnormal; Notable for the following:    WBC 17.2 (*)    Hemoglobin 11.4 (*)    HCT 35.0 (*)    Platelets 430 (*)    Neutro Abs 14.7 (*)    All other components within normal limits  CBG MONITORING, ED - Abnormal; Notable for the following:    Glucose-Capillary 541 (*)    All other components within normal limits  URINALYSIS, ROUTINE W REFLEX MICROSCOPIC    EKG  EKG Interpretation  Date/Time:  Sunday August 19 2016 12:08:38 EDT Ventricular Rate:  110 PR Interval:    QRS Duration: 90 QT Interval:  350 QTC Calculation: 474 R Axis:   141 Text Interpretation:  Sinus tachycardia Consider right atrial enlargement Right axis deviation Borderline T wave abnormalities Confirmed by Rubin Payor  MD, Jhanvi Drakeford [phone]) on 08/19/2016 12:18:13 PM       Radiology Dg Chest 2 View  Result Date: 08/19/2016 CLINICAL DATA:  Altered mental status EXAM: CHEST  2 VIEW COMPARISON:  None. FINDINGS: The heart size and mediastinal contours are within normal limits. Both lungs are clear. The visualized skeletal structures are unremarkable. IMPRESSION: No active  cardiopulmonary disease. Electronically Signed   By: Elige Ko   On: 08/19/2016 12:55    Procedures Procedures (including critical care time)  Medications Ordered in ED Medications  dextrose 5 %-0.45 % sodium chloride infusion (not administered)  insulin regular (NOVOLIN R,HUMULIN R) 250 Units in sodium chloride 0.9 % 250 mL (1 Units/mL) infusion (not administered)  0.9 %  sodium chloride infusion (not administered)  sodium chloride 0.9 % bolus 1,000 mL (not administered)     Initial Impression /  Assessment and Plan / ED Course  I have reviewed the triage vital signs and the nursing notes.  Pertinent labs & imaging results that were available during my care of the patient were reviewed by me and considered in my medical decision making (see chart for details).     Patient presents with mental status changes and hyperglycemia. Found to be in DKA. Family reportedly committing the patient. Family not present on initial arrival.  pH is 7.19. With a bicarbonate of 12. Insulin drip started. Will admit to internal medicine to stepdown or  ICU.  CRITICAL CARE Performed by: Billee Cashing Total critical care time: 30 minutes Critical care time was exclusive of separately billable procedures and treating other patients. Critical care was necessary to treat or prevent imminent or life-threatening deterioration. Critical care was time spent personally by me on the following activities: development of treatment plan with patient and/or surrogate as well as nursing, discussions with consultants, evaluation of patient's response to treatment, examination of patient, obtaining history from patient or surrogate, ordering and performing treatments and interventions, ordering and review of laboratory studies, ordering and review of radiographic studies, pulse oximetry and re-evaluation of patient's condition.   Final Clinical Impressions(s) / ED Diagnoses   Final diagnoses:  Diabetic  ketoacidosis without coma associated with type 1 diabetes mellitus (HCC)    New Prescriptions New Prescriptions   No medications on file   I personally performed the services described in this documentation, which was scribed in my presence. The recorded information has been reviewed and considered. Benjiman Core, MD   Benjiman Core, MD 08/19/16 [phone]

## 2016-08-19 NOTE — ED Notes (Addendum)
Date and time results received: 08/19/16    Test: glucose Critical Value: 590  Name of Provider Notified: pickering   Orders Received? none

## 2016-08-19 NOTE — Progress Notes (Signed)
**Note De-Identified Blade Scheff Obfuscation** VBG PV reported to RN

## 2016-08-19 NOTE — ED Triage Notes (Signed)
No insulin for 2 days.  EMS states she is confused and having mental issues.  Family having commitment papers completed.  CBG high and EMS gave 500 cc normal saline and repeat CBG 585.  VSS.

## 2016-08-19 NOTE — H&P (Signed)
History and Physical    Kellie Pearson:[id number] DOB: 08/21/1965 DOA: 08/19/2016  PCP: Odette Fraction, MD  Patient coming from: Home   I have personally briefly reviewed patient's old medical records in [place]  Chief Complaint: AMS  HPI: Kellie Pearson is a 51 y.o. female with medical history significant of  DM, DKA, Peri-rectal abscess, who presents with generalized weakness, she relates that she collapse today. She has not been able to walk, feels tired. She report generalized pain, and pain in her feet. She denies rectal pain or any skin infection. No cough, or abdominal pain.   She is accompanied by her sister. Patient currently lives with her mother and sister. They are very concern with patient safety. She has not been taking her medications. She has vision problems and has been driving. She talks out of her head, think somebody is putting something to her insulin, hear things and sees things per sister report. Family would like for patient to go to a  Facility.   ED Course: Patient presents with AMS, found to be in DKA, Gap; 25, PH 7.1, bicarb 12, glucose 590, Alkaline phosphatase 169,bili 1.5, cr 1.3, WBC 17, chest x ray; no active cardiopulmonary diseases.    Review of Systems: As per HPI otherwise 10 point review of systems negative.    Past Medical History:  Diagnosis Date  . Abscess    admitted 5/14  . Diabetes mellitus without complication ([place])   . Hidradenitis suppurativa     Past Surgical History:  Procedure Laterality Date  . BACK SURGERY       reports that she has quit smoking. She has never used smokeless tobacco. She reports that she does not drink alcohol or use drugs.  No Known Allergies  Family History  Problem Relation Age of Onset  . Diabetes Mother   . Diabetes Father      Prior to Admission medications   Medication Sig Start Date End Date Taking? Authorizing Provider  clindamycin (CLEOCIN) 300 MG capsule Take 1 capsule (300  mg total) by mouth 3 (three) times daily. 06/18/16  Yes Erline Hau, MD  insulin aspart protamine- aspart (NOVOLOG MIX 70/30) (70-30) 100 UNIT/ML injection Inject 0.2 mLs (20 Units total) into the skin 2 (two) times daily with a meal. 06/18/16  Yes Erline Hau, MD  Multiple Vitamin (MULTIVITAMIN WITH MINERALS) TABS tablet Take 1 tablet by mouth daily. 06/19/16  Yes Erline Hau, MD    Physical Exam: Vitals:   08/19/16 1204 08/19/16 1230 08/19/16 1300 08/19/16 1330  BP: 136/77 133/77 129/88 (!) 164/69  Pulse: (!) 111 (!) 108  (!) 109  Resp: '16 17 18 18  ' Temp: 97.9 F (36.6 C)     TempSrc: Oral     SpO2: 100% 100%  100%  Weight:      Height:        Constitutional: NAD, calm, comfortable, lethargic.  Vitals:   08/19/16 1204 08/19/16 1230 08/19/16 1300 08/19/16 1330  BP: 136/77 133/77 129/88 (!) 164/69  Pulse: (!) 111 (!) 108  (!) 109  Resp: '16 17 18 18  ' Temp: 97.9 F (36.6 C)     TempSrc: Oral     SpO2: 100% 100%  100%  Weight:      Height:       Eyes: PERRL, lids and conjunctivae normal ENMT: Mucous membranes are dry. Posterior pharynx clear of any exudate or lesions. Neck: normal, supple, no masses,  no thyromegaly Respiratory: clear to auscultation bilaterally, no wheezing, no crackles. Normal respiratory effort. No accessory muscle use.  Cardiovascular: Regular rate and rhythm, no murmurs / rubs / gallops. No extremity edema. 2+ pedal pulses. No carotid bruits.  Abdomen: no tenderness, no masses palpated. No hepatosplenomegaly. Bowel sounds positive.  Musculoskeletal: no clubbing / cyanosis. No joint deformity upper and lower extremities. Good ROM, no contractures. Normal muscle tone.  Skin: no rashes, lesions, ulcers. No induration Neurologic: CN 2-12 grossly intact. Lethargic , moves all four extremities.  Psychiatric: unable to evaluate     Labs on Admission: I have personally reviewed following labs and imaging  studies  CBC:  Recent Labs Lab 08/19/16 1220  WBC 17.2*  NEUTROABS 14.7*  HGB 11.4*  HCT 35.0*  MCV 80.5  PLT 269*   Basic Metabolic Panel:  Recent Labs Lab 08/19/16 1220  NA 135  K 4.7  CL 98*  CO2 12*  GLUCOSE 590*  BUN 20  CREATININE 1.32*  CALCIUM 9.8   GFR: Estimated Creatinine Clearance: 50.7 mL/min (A) (by C-G formula based on SCr of 1.32 mg/dL (H)). Liver Function Tests:  Recent Labs Lab 08/19/16 1220  AST 8*  ALT 11*  ALKPHOS 169*  BILITOT 1.5*  PROT 8.7*  ALBUMIN 3.2*   No results for input(s): LIPASE, AMYLASE in the last 168 hours. No results for input(s): AMMONIA in the last 168 hours. Coagulation Profile: No results for input(s): INR, PROTIME in the last 168 hours. Cardiac Enzymes: No results for input(s): CKTOTAL, CKMB, CKMBINDEX, TROPONINI in the last 168 hours. BNP (last 3 results) No results for input(s): PROBNP in the last 8760 hours. HbA1C: No results for input(s): HGBA1C in the last 72 hours. CBG:  Recent Labs Lab 08/19/16 1205  GLUCAP 541*   Lipid Profile: No results for input(s): CHOL, HDL, LDLCALC, TRIG, CHOLHDL, LDLDIRECT in the last 72 hours. Thyroid Function Tests: No results for input(s): TSH, T4TOTAL, FREET4, T3FREE, THYROIDAB in the last 72 hours. Anemia Panel: No results for input(s): VITAMINB12, FOLATE, FERRITIN, TIBC, IRON, RETICCTPCT in the last 72 hours.  Radiological Exams on Admission: Dg Chest 2 View  Result Date: 08/19/2016 CLINICAL DATA:  Altered mental status EXAM: CHEST  2 VIEW COMPARISON:  None. FINDINGS: The heart size and mediastinal contours are within normal limits. Both lungs are clear. The visualized skeletal structures are unremarkable. IMPRESSION: No active cardiopulmonary disease. Electronically Signed   By: Kathreen Devoid   On: 08/19/2016 12:55    EKG: sinus tachycardia.   Assessment/Plan Active Problems:   Metabolic acidosis   DKA (diabetic ketoacidoses) ([place])   Diabetes mellitus with  complication ([place])  1-DKA;  In setting of medications no complaint.  IV fluids, IV insulin Gtt. Replete K as needed.  Evaluate for infection; Check UA, urine culture.   2-Acute encephalopathy; in setting of DKA, acidosis.  Check ammonia level.  Treat for DKA>   3-Psychiatric disorder. Per family history of schizophrenia.  Per family patient has been having, visual and auditory hallucination. Paranoid, not taking meds.  Will need to consult Tele-psych in am, after resolution of DKA>   4-increase bili, Alk phosphatase; repeat labs in am.     DVT prophylaxis: Lovenox.  Code Status: Full code.  Family Communication: Care discussed with sister.  Disposition Plan: Might need placement.  Consults called: none Admission status: inpatient, step down unit.    Elmarie Shiley MD [facility] Pager [phone]  If 7PM-7AM, please contact night-coverage [email or web address] Password [email or web address]  08/19/2016, 2:39 PM

## 2016-08-20 DIAGNOSIS — E872 Acidosis: Secondary | ICD-10-CM

## 2016-08-20 DIAGNOSIS — N390 Urinary tract infection, site not specified: Secondary | ICD-10-CM

## 2016-08-20 LAB — HEPATIC FUNCTION PANEL
ALBUMIN: 2.6 g/dL — AB (ref 3.5–5.0)
ALT: 9 U/L — ABNORMAL LOW (ref 14–54)
AST: 10 U/L — ABNORMAL LOW (ref 15–41)
Alkaline Phosphatase: 135 U/L — ABNORMAL HIGH (ref 38–126)
BILIRUBIN DIRECT: 0.1 mg/dL (ref 0.1–0.5)
BILIRUBIN INDIRECT: 0.6 mg/dL (ref 0.3–0.9)
BILIRUBIN TOTAL: 0.7 mg/dL (ref 0.3–1.2)
Total Protein: 7.5 g/dL (ref 6.5–8.1)

## 2016-08-20 LAB — BASIC METABOLIC PANEL
ANION GAP: 12 (ref 5–15)
Anion gap: 7 (ref 5–15)
Anion gap: 12 (ref 5–15)
BUN: 14 mg/dL (ref 6–20)
BUN: 14 mg/dL (ref 6–20)
BUN: 16 mg/dL (ref 6–20)
CHLORIDE: 109 mmol/L (ref 101–111)
CO2: 23 mmol/L (ref 22–32)
CO2: 18 mmol/L — ABNORMAL LOW (ref 22–32)
CO2: 20 mmol/L — ABNORMAL LOW (ref 22–32)
Calcium: 9 mg/dL (ref 8.9–10.3)
Calcium: 9 mg/dL (ref 8.9–10.3)
Calcium: 9.2 mg/dL (ref 8.9–10.3)
Chloride: 107 mmol/L (ref 101–111)
Chloride: 104 mmol/L (ref 101–111)
Creatinine, Ser: 0.79 mg/dL (ref 0.44–1.00)
Creatinine, Ser: 0.9 mg/dL (ref 0.44–1.00)
Creatinine, Ser: 0.97 mg/dL (ref 0.44–1.00)
GFR calc Af Amer: >60 mL/min (ref >60)
GFR calc Af Amer: >60 mL/min (ref >60)
GFR calc Af Amer: >60 mL/min (ref >60)
GFR calc non Af Amer: >60 mL/min (ref >60)
GLUCOSE: 156 mg/dL — AB (ref 65–99)
GLUCOSE: 422 mg/dL — AB (ref 65–99)
Glucose, Bld: 276 mg/dL — ABNORMAL HIGH (ref 65–99)
POTASSIUM: 4.5 mmol/L (ref 3.5–5.1)
Potassium: 3.4 mmol/L — ABNORMAL LOW (ref 3.5–5.1)
Potassium: 4.5 mmol/L (ref 3.5–5.1)
SODIUM: 139 mmol/L (ref 135–145)
Sodium: 137 mmol/L (ref 135–145)
Sodium: 136 mmol/L (ref 135–145)

## 2016-08-20 LAB — GLUCOSE, CAPILLARY
GLUCOSE-CAPILLARY: 163 mg/dL — AB (ref 65–99)
GLUCOSE-CAPILLARY: 286 mg/dL — AB (ref 65–99)
Glucose-Capillary: 270 mg/dL — ABNORMAL HIGH (ref 65–99)
Glucose-Capillary: 324 mg/dL — ABNORMAL HIGH (ref 65–99)
Glucose-Capillary: 259 mg/dL — ABNORMAL HIGH (ref 65–99)
Glucose-Capillary: 294 mg/dL — ABNORMAL HIGH (ref 65–99)
Glucose-Capillary: 262 mg/dL — ABNORMAL HIGH (ref 65–99)

## 2016-08-20 MED ORDER — INSULIN GLARGINE 100 UNIT/ML ~~LOC~~ SOLN
10.0000 [IU] | Freq: Two times a day (BID) | SUBCUTANEOUS | Status: DC
  Filled 2016-08-20 (×2): qty 0.1

## 2016-08-20 MED ORDER — INSULIN ASPART 100 UNIT/ML ~~LOC~~ SOLN
4.0000 [IU] | Freq: Three times a day (TID) | SUBCUTANEOUS | Status: DC
  Administered 2016-08-20 – 2016-08-21 (×3): 4 [IU] via SUBCUTANEOUS

## 2016-08-20 MED ORDER — INSULIN ASPART PROT & ASPART (70-30 MIX) 100 UNIT/ML ~~LOC~~ SUSP
SUBCUTANEOUS | Status: AC
  Filled 2016-08-20: qty 10

## 2016-08-20 MED ORDER — SODIUM CHLORIDE 0.9 % IV BOLUS (SEPSIS)
1000.0000 mL | Freq: Once | INTRAVENOUS | Status: AC
  Administered 2016-08-20: 1000 mL via INTRAVENOUS

## 2016-08-20 MED ORDER — INSULIN ASPART 100 UNIT/ML ~~LOC~~ SOLN
0.0000 [IU] | Freq: Three times a day (TID) | SUBCUTANEOUS | Status: DC
  Administered 2016-08-20: 8 [IU] via SUBCUTANEOUS
  Administered 2016-08-20: 11 [IU] via SUBCUTANEOUS
  Administered 2016-08-20: 8 [IU] via SUBCUTANEOUS
  Administered 2016-08-21: 5 [IU] via SUBCUTANEOUS
  Administered 2016-08-21: 8 [IU] via SUBCUTANEOUS
  Administered 2016-08-21: 15 [IU] via SUBCUTANEOUS
  Administered 2016-08-22: 2 [IU] via SUBCUTANEOUS
  Administered 2016-08-22 – 2016-08-23 (×3): 3 [IU] via SUBCUTANEOUS
  Administered 2016-08-23: 5 [IU] via SUBCUTANEOUS
  Administered 2016-08-24: 3 [IU] via SUBCUTANEOUS
  Administered 2016-08-24: 2 [IU] via SUBCUTANEOUS
  Administered 2016-08-24 – 2016-08-25 (×2): 3 [IU] via SUBCUTANEOUS
  Administered 2016-08-25: 5 [IU] via SUBCUTANEOUS

## 2016-08-20 MED ORDER — FLUCONAZOLE 100 MG PO TABS
100.0000 mg | ORAL_TABLET | Freq: Every day | ORAL | Status: DC
  Administered 2016-08-20 – 2016-08-25 (×6): 100 mg via ORAL
  Filled 2016-08-20 (×6): qty 1

## 2016-08-20 MED ORDER — INSULIN GLARGINE 100 UNIT/ML ~~LOC~~ SOLN
15.0000 [IU] | Freq: Two times a day (BID) | SUBCUTANEOUS | Status: DC
  Administered 2016-08-20: 15 [IU] via SUBCUTANEOUS
  Filled 2016-08-20 (×2): qty 0.15

## 2016-08-20 MED ORDER — INSULIN GLARGINE 100 UNIT/ML ~~LOC~~ SOLN: 20.0000 [IU] | Freq: Every day | SUBCUTANEOUS | Status: DC

## 2016-08-20 MED ORDER — POTASSIUM CHLORIDE CRYS ER 10 MEQ PO TBCR: 30.0000 meq | EXTENDED_RELEASE_TABLET | Freq: Two times a day (BID) | ORAL | Status: DC

## 2016-08-20 MED ORDER — INSULIN GLARGINE 100 UNIT/ML ~~LOC~~ SOLN
10.0000 [IU] | Freq: Two times a day (BID) | SUBCUTANEOUS | Status: DC
  Filled 2016-08-20: qty 0.1

## 2016-08-20 MED ORDER — ENOXAPARIN SODIUM 40 MG/0.4ML ~~LOC~~ SOLN
40.0000 mg | SUBCUTANEOUS | Status: DC
  Administered 2016-08-20 – 2016-08-23 (×4): 40 mg via SUBCUTANEOUS
  Filled 2016-08-20 (×4): qty 0.4

## 2016-08-20 MED ORDER — INSULIN ASPART PROT & ASPART (70-30 MIX) 100 UNIT/ML ~~LOC~~ SUSP
15.0000 [IU] | Freq: Once | SUBCUTANEOUS | Status: AC
  Administered 2016-08-20: 15 [IU] via SUBCUTANEOUS
  Filled 2016-08-20: qty 10

## 2016-08-20 MED ORDER — CLOTRIMAZOLE 1 % EX CREA
TOPICAL_CREAM | Freq: Two times a day (BID) | CUTANEOUS | Status: DC
  Administered 2016-08-20: 22:00:00 via TOPICAL
  Administered 2016-08-20: 1 via TOPICAL
  Administered 2016-08-21 (×2): via TOPICAL
  Administered 2016-08-22: 1 via TOPICAL
  Administered 2016-08-22 – 2016-08-25 (×6): via TOPICAL
  Filled 2016-08-20: qty 15

## 2016-08-20 MED ORDER — GABAPENTIN 100 MG PO CAPS
100.0000 mg | ORAL_CAPSULE | Freq: Three times a day (TID) | ORAL | Status: DC
  Administered 2016-08-20 – 2016-08-25 (×16): 100 mg via ORAL
  Filled 2016-08-20 (×16): qty 1

## 2016-08-20 NOTE — Progress Notes (Addendum)
PROGRESS NOTE    JESSIKAH DICKER  PIR:[id number] DOB: July 11, 1965 DOA: 08/19/2016 PCP: Odette Fraction, MD    Brief Narrative: Kellie Pearson is a 51 y.o. female with medical history significant of  DM, DKA, Peri-rectal abscess, who presents with generalized weakness, she relates that she collapse today. She has not been able to walk, feels tired. She report generalized pain, and pain in her feet. She denies rectal pain or any skin infection. No cough, or abdominal pain.   She is accompanied by her sister. Patient currently lives with her mother and sister. They are very concern with patient safety. She has not been taking her medications. She has vision problems and has been driving. She talks out of her head, think somebody is putting something to her insulin, hear things and sees things per sister report. Family would like for patient to go to a  Facility.   Patient presents with AMS, found to be in DKA, Gap; 25, PH 7.1, bicarb 12, glucose 590, Alkaline phosphatase 169,bili 1.5, cr 1.3, WBC 17, chest x ray; no active cardiopulmonary diseases.     Assessment & Plan:   Active Problems:   Metabolic acidosis   DKA (diabetic ketoacidoses) (HCC)   Diabetes mellitus with complication ([place])  1-DKA, DM She was started on long acting insulin. Will repeat B-met this afternoon. Depending on results might need to be back on insulin Gtt change insulin to lantus. SSI.   2-Acute encephalopathy; in setting of DKA, acidosis. UTI Improved, alert, no lethargic.  Ammonia normal.   3-Psychiatric disorder. Per family history of schizophrenia.  Per family patient has been having, visual and auditory hallucination. Paranoid, not taking meds.  Psych consulted.  Patient evaluated by TTS; recommendations are for inpatient Psych facility. Patient will need to be IVC if she doesn't agree to go to psych facility.   4-increase bili, Alk phosphatase; repeat labs in am.  trending down.   5-UTI; UA with too  numerous to count WBC.  Follow urine culture  Continue with ceftriaxone.   6-DM, neuropathy;  Report pain feet. Difficult to walk due to pain/  Start Gabapentin.   7-Vaginal candidiasis. Oral fluconazole.  8-Leukocytosis; repeat labs in am. Related to UTI.    DVT prophylaxis; Lovenox.  Code Status: Full code.  Family Communication: Sister who was at bedside.  Disposition Plan: to be determine, family would like for patient to be placed.  Consultants:   Psych.    Procedures:    Antimicrobials:  Ceftriaxone 4-29  Subjective: She is feeling better, stronger, no as sleep as yesterday.  Still with pain feet.   Objective: Vitals:   08/20/16 0300 08/20/16 0400 08/20/16 0500 08/20/16 0600  BP: 120/69 119/73 (!) 143/84 (!) 93/56  Pulse: 99 99 (!) 105 97  Resp: (!) '21 20 16 16  ' Temp:  99.4 F (37.4 C)    TempSrc:  Oral    SpO2: 98% 99% 100% 99%  Weight:   63.4 kg (139 lb 12.4 oz)   Height:        Intake/Output Summary (Last 24 hours) at 08/20/16 0738 Last data filed at 08/20/16 0430  Gross per 24 hour  Intake           992.92 ml  Output              550 ml  Net           442.92 ml   Filed Weights   08/19/16 1558 08/19/16 1559 08/20/16 0500  Weight: 62.5 kg (137 lb 12.6 oz) 62.5 kg (137 lb 12.6 oz) 63.4 kg (139 lb 12.4 oz)    Examination:  General exam: Appears calm and comfortable  Respiratory system: Clear to auscultation. Respiratory effort normal. Cardiovascular system: S1 & S2 heard, RRR. No JVD, murmurs, rubs, gallops or clicks. No pedal edema. Gastrointestinal system: Abdomen is nondistended, soft and nontender. No organomegaly or masses felt. Normal bowel sounds heard. Central nervous system: Alert and oriented. No focal neurological deficits. Extremities: Symmetric 5 x 5 power. Skin: No rashes, lesions or ulcers Psychiatry: Judgement and insight appear normal. Mood & affect appropriate.     Data Reviewed: I have personally reviewed following labs  and imaging studies  CBC:  Recent Labs Lab 08/19/16 1220  WBC 17.2*  NEUTROABS 14.7*  HGB 11.4*  HCT 35.0*  MCV 80.5  PLT 354*   Basic Metabolic Panel:  Recent Labs Lab 08/19/16 1220 08/19/16 1918 08/19/16 2327 08/20/16 0458  NA 135 138 139 137  K 4.7 3.8 3.4* 4.5  CL 98* 106 109 107  CO2 12* 20* 23 18*  GLUCOSE 590* 269* 156* 276*  BUN '20 14 14 14  ' CREATININE 1.32* 1.02* 0.79 0.90  CALCIUM 9.8 8.8* 9.0 9.0   GFR: Estimated Creatinine Clearance: 74.3 mL/min (by C-G formula based on SCr of 0.9 mg/dL). Liver Function Tests:  Recent Labs Lab 08/19/16 1220 08/20/16 0458  AST 8* 10*  ALT 11* 9*  ALKPHOS 169* 135*  BILITOT 1.5* 0.7  PROT 8.7* 7.5  ALBUMIN 3.2* 2.6*   No results for input(s): LIPASE, AMYLASE in the last 168 hours.  Recent Labs Lab 08/19/16 1515  AMMONIA <9*   Coagulation Profile: No results for input(s): INR, PROTIME in the last 168 hours. Cardiac Enzymes: No results for input(s): CKTOTAL, CKMB, CKMBINDEX, TROPONINI in the last 168 hours. BNP (last 3 results) No results for input(s): PROBNP in the last 8760 hours. HbA1C: No results for input(s): HGBA1C in the last 72 hours. CBG:  Recent Labs Lab 08/19/16 2122 08/19/16 2222 08/19/16 2319 08/20/16 0022 08/20/16 0415  GLUCAP 234* 179* 154* 163* 270*   Lipid Profile: No results for input(s): CHOL, HDL, LDLCALC, TRIG, CHOLHDL, LDLDIRECT in the last 72 hours. Thyroid Function Tests: No results for input(s): TSH, T4TOTAL, FREET4, T3FREE, THYROIDAB in the last 72 hours. Anemia Panel: No results for input(s): VITAMINB12, FOLATE, FERRITIN, TIBC, IRON, RETICCTPCT in the last 72 hours. Sepsis Labs: No results for input(s): PROCALCITON, LATICACIDVEN in the last 168 hours.  Recent Results (from the past 240 hour(s))  MRSA PCR Screening     Status: None   Collection Time: 08/19/16  4:00 PM  Result Value Ref Range Status   MRSA by PCR NEGATIVE NEGATIVE Final    Comment:        The  GeneXpert MRSA Assay (FDA approved for NASAL specimens only), is one component of a comprehensive MRSA colonization surveillance program. It is not intended to diagnose MRSA infection nor to guide or monitor treatment for MRSA infections.          Radiology Studies: Dg Chest 2 View  Result Date: 08/19/2016 CLINICAL DATA:  Altered mental status EXAM: CHEST  2 VIEW COMPARISON:  None. FINDINGS: The heart size and mediastinal contours are within normal limits. Both lungs are clear. The visualized skeletal structures are unremarkable. IMPRESSION: No active cardiopulmonary disease. Electronically Signed   By: Kathreen Devoid   On: 08/19/2016 12:55        Scheduled Meds: .  enoxaparin (LOVENOX) injection  30 mg Subcutaneous Q24H  . insulin aspart  0-15 Units Subcutaneous TID WC  . insulin glargine  10 Units Subcutaneous BID  . multivitamin with minerals  1 tablet Oral Daily   Continuous Infusions: . cefTRIAXone (ROCEPHIN)  IV Stopped (08/19/16 1903)  . sodium chloride       LOS: 1 day    Time spent: 35 minutes.     Elmarie Shiley, MD [facility] Pager [phone]  If 7PM-7AM, please contact night-coverage [email or web address] Password [facility] 08/20/2016, 7:38 AM

## 2016-08-20 NOTE — BH Assessment (Addendum)
[facility] Assessment Progress Note  Collateral information: Spoke with Marcelino Duster (sister) [phone] who states that pt is "not the sister I had, she is unhealthy due to her diabetes and now she has mental issues that prevent her from taking care of herself". She states that pt used to go to mental health, pt was diagnosed with schizophrenia in the past. She states that pt has delusions and says that she invented Social worker, designed certain automobiles, people have been poisoning her medication, taking food, etc. She states that pt drives with very poor vision, staggers around, family feels like they need to follow pt any time she gets in a car because they afre afraid she will hurt herself or someone else, "But following her is not going to prevent anything from happening! I just love my sister and want her to get help".   Spoke with mom, Bonita Quin (with whom pt loves) who says "sometimes she does good and sometimes not". She states that she thinks that pt had been depressed before her admission, she had been, "laying down a lot". She states that pt had anxiety attack a couple of weeks ago and could not breathe. She confirms that pt was treated at Mental health in the past and is not taking her medication.   She states that family was going to take out IVC papers on pt, but it was recommended by someone they talked to that they let the hospital do it. She states she will do anything necessary to get help for her sister.  Fransisca Kaufmann, NP recommends IP treatment (with possible IVC) for pt when medically cleared. She states that if family can find out what medication pt was on, it could be re-started, but family is unaware of the medication at this time.

## 2016-08-20 NOTE — Progress Notes (Signed)
1013 while toileting the patient, thick white patches with redness noted to vaginal region, patient c/o tenderness to the area and white chunks noted floating in patient's urine, MD notified.
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
## 2016-08-20 NOTE — BH Assessment (Signed)
Tele Assessment Note   Kellie Pearson is an 51 y.o. female who was referred by Dr Sunnie Nielsen, treating hospitalist. Pt came to [facility] with EMS after being found in DKA. Pt states, "I fell on the floor, and that is why I am here". She denies SI, HI, AVH, SA. She denies ever having any previous mental health problems or hospitalizations, IP or OP treatment. She admits to sleeping more than usual due to leg pain she states is caused by possible neuropathy. Pt's mood is slightly irritable with congruent affect and she says she wants to go home. She states that she lives with her parents because , "Apparently, the government says I have to, but I just wanted to live my best life as a single girl to find a partner and have kids. I think I can live on my own. She states that she is her own guardian. ". Pt was cooperative, but seemed confused as to why writer was being consulted. Pt has poor insight and judgment.  ? MSE: Pt is casually dressed, alert, with normal speech and normal motor behavior.  Pt is oriented to place and self, not date. Eye contact is poor.  Thought process is coherent and relevant. There is no indication at this time that pt is currently responding to internal stimuli, but she could be experiencing delusional thought content. Pt was cooperative throughout assessment.   Collateral per Dr. Sunnie Nielsen, "She is accompanied by her sister. Patient currently lives with her mother and sister. They are very concern with patient safety. She has not been taking her medications. She has vision problems and has been driving. She talks out of her head, think somebody is putting something to her insulin, hear things and sees things per sister report. Family would like for patient to go to a facility".  Attempted to call family for more collateral, but no one answered home number.  [facility] staff will call writer when family arrives at the hospital.  Disposition pending gaining collateral from  family.  Diagnosis: Psychotic disorder NOS Past Medical History:  Past Medical History:  Diagnosis Date  . Abscess    admitted 5/14  . Diabetes mellitus without complication (HCC)   . Hidradenitis suppurativa     Past Surgical History:  Procedure Laterality Date  . BACK SURGERY      Family History:  Family History  Problem Relation Age of Onset  . Diabetes Mother   . Diabetes Father     Social History:  reports that she has quit smoking. She has never used smokeless tobacco. She reports that she does not drink alcohol or use drugs.  Additional Social History:  Alcohol / Drug Use Pain Medications: denies Prescriptions: denies Over the Counter: denies History of alcohol / drug use?: No history of alcohol / drug abuse Longest period of sobriety (when/how long):  (denies) Negative Consequences of Use:  (denies) Withdrawal Symptoms:  (denies)  CIWA: CIWA-Ar BP: 126/80 Pulse Rate: (!) 112 COWS:    PATIENT STRENGTHS: (choose at least two) Ability for insight Average or above average intelligence Communication skills Supportive family/friends  Allergies: No Known Allergies  Home Medications:  Medications Prior to Admission  Medication Sig Dispense Refill  . clindamycin (CLEOCIN) 300 MG capsule Take 1 capsule (300 mg total) by mouth 3 (three) times daily. 30 capsule 0  . insulin aspart protamine- aspart (NOVOLOG MIX 70/30) (70-30) 100 UNIT/ML injection Inject 0.2 mLs (20 Units total) into the skin 2 (two) times daily with a meal.  10 mL 11  . Multiple Vitamin (MULTIVITAMIN WITH MINERALS) TABS tablet Take 1 tablet by mouth daily.      OB/GYN Status:  Patient's last menstrual period was 07/26/2016.  General Assessment Data Location of Assessment:  ([facility]) TTS Assessment: In system Is this a Tele or Face-to-Face Assessment?: Tele Assessment Is this an Initial Assessment or a Re-assessment for this encounter?: Initial Assessment Marital status: Single Living  Arrangements: Parent Can pt return to current living arrangement?: Yes Admission Status: Voluntary Is patient capable of signing voluntary admission?: Yes Referral Source: Medical Floor Inpatient Insurance type: [facility]     Crisis Care Plan Living Arrangements: Parent Name of Psychiatrist: none  Name of Therapist: none  Education Status Is patient currently in school?: No  Risk to self with the past 6 months Suicidal Ideation: No Has patient been a risk to self within the past 6 months prior to admission? : No Suicidal Intent: No Has patient had any suicidal intent within the past 6 months prior to admission? : No Is patient at risk for suicide?: No Suicidal Plan?: No Has patient had any suicidal plan within the past 6 months prior to admission? : No Access to Means: No What has been your use of drugs/alcohol within the last 12 months?:  (denies) Previous Attempts/Gestures:  (denies) Intentional Self Injurious Behavior: None Family Suicide History: Unable to assess Recent stressful life event(s):  (doesn't want to live with parents) Persecutory voices/beliefs?: Yes (per family) Depression: No Depression Symptoms: Fatigue, Loss of interest in usual pleasures Substance abuse history and/or treatment for substance abuse?: No Suicide prevention information given to non-admitted patients: Not applicable  Risk to Others within the past 6 months Homicidal Ideation: No Does patient have any lifetime risk of violence toward others beyond the six months prior to admission? : Unknown Thoughts of Harm to Others: No Current Homicidal Intent: No Current Homicidal Plan: No Access to Homicidal Means: No History of harm to others?: No Assessment of Violence: None Noted Does patient have access to weapons?: No Criminal Charges Pending?: No Does patient have a court date: No Is patient on probation?: No  Psychosis Hallucinations: Auditory, Visual (per family) Delusions: Persecutory (per  family)  Mental Status Report Appearance/Hygiene: In scrubs, Unremarkable Eye Contact: Poor Motor Activity: Unremarkable Speech: Logical/coherent Level of Consciousness: Alert Mood: Irritable Affect: Apprehensive, Irritable Anxiety Level: Minimal Thought Processes: Coherent, Relevant Judgement: Partial Orientation: Person, Place, Situation Obsessive Compulsive Thoughts/Behaviors: Minimal  Cognitive Functioning Concentration: Fair Memory: Recent Impaired, Remote Intact IQ: Average Insight: Poor Impulse Control: Poor Appetite: Poor Weight Loss:  (5-10 lbs) Weight Gain:  (0) Sleep: Increased Total Hours of Sleep: 12 Vegetative Symptoms: Staying in bed  ADLScreening [facility] Assessment Services) Patient's cognitive ability adequate to safely complete daily activities?: Yes Patient able to express need for assistance with ADLs?: Yes Independently performs ADLs?: Yes (appropriate for developmental age)  Prior Inpatient Therapy Prior Inpatient Therapy:  (pt denies) Prior Therapy Dates:  (denies) Prior Therapy Facilty/Provider(s):  (denies) Reason for Treatment:  (denies)  Prior Outpatient Therapy Prior Outpatient Therapy:  (denies) Prior Therapy Dates:  (denies) Prior Therapy Facilty/Provider(s):  (denies) Reason for Treatment:  (denies) Does patient have an ACCT team?: No Does patient have Intensive In-House Services?  : No Does patient have [facility] services? : No Does patient have P4CC services?: No  ADL Screening (condition at time of admission) Patient's cognitive ability adequate to safely complete daily activities?: Yes Is the patient deaf or have difficulty hearing?: No Does the  patient have difficulty seeing, even when wearing glasses/contacts?: No Does the patient have difficulty concentrating, remembering, or making decisions?: Yes Patient able to express need for assistance with ADLs?: Yes Does the patient have difficulty dressing or bathing?: No Independently  performs ADLs?: Yes (appropriate for developmental age) Does the patient have difficulty walking or climbing stairs?: No Weakness of Legs: None Weakness of Arms/Hands: None  Home Assistive Devices/Equipment Home Assistive Devices/Equipment: CBG Meter  Therapy Consults (therapy consults require a physician order) PT Evaluation Needed: No OT Evalulation Needed: No SLP Evaluation Needed: No Abuse/Neglect Assessment (Assessment to be complete while patient is alone) Physical Abuse: Denies Verbal Abuse: Denies Sexual Abuse: Denies Exploitation of patient/patient's resources: Denies Self-Neglect: Denies Values / Beliefs Cultural Requests During Hospitalization: None Spiritual Requests During Hospitalization: None Consults Spiritual Care Consult Needed: No Social Work Consult Needed: No Merchant navy officer (For Healthcare) Does Patient Have a Medical Advance Directive?: No Would patient like information on creating a medical advance directive?: No - Patient declined Nutrition Screen- [facility] Adult/[facility] Patient's home diet: Regular Has the patient recently lost weight without trying?: No Has the patient been eating poorly because of a decreased appetite?: No Malnutrition Screening Tool Score: 0  Additional Information 1:1 In Past 12 Months?: No CIRT Risk: No Elopement Risk: Yes Does patient have medical clearance?: No     Disposition:  Disposition Initial Assessment Completed for this Encounter: Yes Disposition of Patient: Other dispositions (pending collateral information and psych review) Other disposition(s): Other (Comment)  Aahana Elza Hines 08/20/2016 10:11 AM

## 2016-08-21 DIAGNOSIS — F22 Delusional disorders: Secondary | ICD-10-CM

## 2016-08-21 DIAGNOSIS — Z87891 Personal history of nicotine dependence: Secondary | ICD-10-CM

## 2016-08-21 LAB — URINE CULTURE

## 2016-08-21 LAB — CBC
HEMATOCRIT: 30.8 % — AB (ref 36.0–46.0)
Hemoglobin: 10.2 g/dL — ABNORMAL LOW (ref 12.0–15.0)
MCH: 26 pg (ref 26.0–34.0)
MCHC: 33.1 g/dL (ref 30.0–36.0)
MCV: 78.6 fL (ref 78.0–100.0)
Platelets: 361 10*3/uL (ref 150–400)
RBC: 3.92 MIL/uL (ref 3.87–5.11)
RDW: 14 % (ref 11.5–15.5)
WBC: 10 10*3/uL (ref 4.0–10.5)

## 2016-08-21 LAB — BASIC METABOLIC PANEL
ANION GAP: 12 (ref 5–15)
BUN: 14 mg/dL (ref 6–20)
CHLORIDE: 106 mmol/L (ref 101–111)
CO2: 18 mmol/L — AB (ref 22–32)
Calcium: 8.6 mg/dL — ABNORMAL LOW (ref 8.9–10.3)
Creatinine, Ser: 0.77 mg/dL (ref 0.44–1.00)
GFR calc non Af Amer: >60 mL/min (ref >60)
Glucose, Bld: 347 mg/dL — ABNORMAL HIGH (ref 65–99)
POTASSIUM: 3.5 mmol/L (ref 3.5–5.1)
Sodium: 136 mmol/L (ref 135–145)

## 2016-08-21 LAB — GLUCOSE, CAPILLARY
GLUCOSE-CAPILLARY: 373 mg/dL — AB (ref 65–99)
GLUCOSE-CAPILLARY: 202 mg/dL — AB (ref 65–99)
Glucose-Capillary: 300 mg/dL — ABNORMAL HIGH (ref 65–99)
Glucose-Capillary: 237 mg/dL — ABNORMAL HIGH (ref 65–99)

## 2016-08-21 MED ORDER — SODIUM CHLORIDE 0.9 % IV SOLN
INTRAVENOUS | Status: DC
  Administered 2016-08-22 – 2016-08-24 (×5): via INTRAVENOUS

## 2016-08-21 MED ORDER — INSULIN GLARGINE 100 UNIT/ML ~~LOC~~ SOLN
25.0000 [IU] | Freq: Two times a day (BID) | SUBCUTANEOUS | Status: DC
  Administered 2016-08-21 – 2016-08-22 (×3): 25 [IU] via SUBCUTANEOUS
  Administered 2016-08-23: 12 [IU] via SUBCUTANEOUS
  Administered 2016-08-23: 25 [IU] via SUBCUTANEOUS
  Filled 2016-08-21 (×6): qty 0.25

## 2016-08-21 MED ORDER — INSULIN GLARGINE 100 UNIT/ML ~~LOC~~ SOLN
20.0000 [IU] | Freq: Two times a day (BID) | SUBCUTANEOUS | Status: DC
  Administered 2016-08-21: 20 [IU] via SUBCUTANEOUS
  Filled 2016-08-21 (×3): qty 0.2

## 2016-08-21 MED ORDER — SODIUM CHLORIDE 0.9 % IV BOLUS (SEPSIS)
500.0000 mL | Freq: Once | INTRAVENOUS | Status: AC
  Administered 2016-08-21: 500 mL via INTRAVENOUS

## 2016-08-21 MED ORDER — INSULIN ASPART 100 UNIT/ML ~~LOC~~ SOLN
6.0000 [IU] | Freq: Three times a day (TID) | SUBCUTANEOUS | Status: DC
  Administered 2016-08-21 – 2016-08-25 (×13): 6 [IU] via SUBCUTANEOUS

## 2016-08-21 NOTE — Care Management Note (Signed)
Case Management Note  Patient Details  Name: JONIECE SMOTHERMAN MRN: [id number] Date of Birth: 1965/06/14  Subjective/Objective:  Adm DKA. From home with family. Seen by PT, recommended for SNF. Also recommended for inpatient treatment after TTS consult.                   Action/Plan: CSW aware and making arrangements. CM will continue to follow.   Expected Discharge Date:    08/23/2016              [facility] Plan[facility]  [facility]  In-House Referral:  Clinical Social Work  Discharge planning Services  CM Consult  Post Acute Care Choice:    Choice offered to:     DME Arranged:    DME Agency:     HH Arranged:    HH Agency:     Status of Service:  In process, will continue to follow  If discussed at Long Length of Stay Meetings, dates discussed:    Additional Comments:  Tawnie Ehresman, Chrystine Oiler, RN 08/21/2016, 3:06 PM

## 2016-08-21 NOTE — Progress Notes (Signed)
Inpatient Diabetes Program Recommendations  AACE/ADA: New Consensus Statement on Inpatient Glycemic Control (2015)  Target Ranges:  Prepandial:   less than 140 mg/dL      Peak postprandial:   less than 180 mg/dL (1-2 hours)      Critically ill patients:  140 - 180 mg/dL   Results for Kellie Pearson, Kellie Pearson (MRN [id number]) as of 08/21/2016 08:15  Ref. Range 08/20/2016 00:22 08/20/2016 04:15 08/20/2016 07:38 08/20/2016 11:42 08/20/2016 16:08 08/20/2016 17:33 08/20/2016 21:30 08/21/2016 07:22  Glucose-Capillary Latest Ref Range: 65 - 99 mg/dL 784 (H) 696 (H) 295 (H) 324 (H) 259 (H) 294 (H) 262 (H) 300 (H)   Review of Glycemic Control  Outpatient Diabetes medications: 70/30 20 units BID Current orders for Inpatient glycemic control: Lantus 20 units BID, Novolog 4 units TID with meals for meal coverage, Novolog 0-15 units TID with meals  Inpatient Diabetes Program Recommendations: Insulin - Basal: Noted Lantus was increased from 15 to 20 units BID today. Correction (SSI): Please consider ordering Novolog 0-5 units QHS for bedtime correction scale. Insulin - Meal Coverage: Please consider increasing meal coverage to Novolog 6 units TID with meals for meal coverage. HgbA1C: Please add on an A1C to blood in lab if available or draw with next scheduled blood draw to evaluate glycemic control over the past 2-3 months.   NOTE: Noted in reviewing chart that patient has not been taking insulin and " They (patient's mother and sister) are very concern with patient safety. She has not been taking her medications. She has vision problems and has been driving. She talks out of her head, think somebody is putting something to her insulin, hear things and sees things per sister report. Family would like for patient to go to a  Facility."  Per MD progress note on 08/20/16, patient will be admitted to inpatient psychiatric facility once medically cleared.   Thanks, Orlando Penner, RN, MSN, CDE Diabetes Coordinator Inpatient  Diabetes Program [phone] (Team Pager from 8am to 5pm)

## 2016-08-21 NOTE — Progress Notes (Signed)
PROGRESS NOTE    ANOUSHKA DIVITO  VOZ:[id number] DOB: Sep 05, 1965 DOA: 08/19/2016 PCP: Odette Fraction, MD    Brief Narrative: Kellie Pearson is a 51 y.o. female with medical history significant of  DM, DKA, Peri-rectal abscess, who presents with generalized weakness, she relates that she collapse today. She has not been able to walk, feels tired. She report generalized pain, and pain in her feet. She denies rectal pain or any skin infection. No cough, or abdominal pain.   She is accompanied by her sister. Patient currently lives with her mother and sister. They are very concern with patient safety. She has not been taking her medications. She has vision problems and has been driving. She talks out of her head, think somebody is putting something to her insulin, hear things and sees things per sister report. Family would like for patient to go to a  Facility.   Patient presents with AMS, found to be in DKA, [place]; 25, PH 7.1, bicarb 12, glucose 590, Alkaline phosphatase 169,bili 1.5, cr 1.3, WBC 17, chest x ray; no active cardiopulmonary diseases.   Admitted with DKA, UTI, encephalopathy .   Assessment & Plan:   Active Problems:   Metabolic acidosis   DKA (diabetic ketoacidoses) ([place])   Diabetes mellitus with complication ([place])   Acute lower UTI  1-DKA, DM She was started on long acting insulin. Will repeat B-met this afternoon. Depending on results might need to be back on insulin Gtt Increase lantus to 25 units BID. Increase meals coverage.  IV fluids. Repeat labs in am.   2-Acute encephalopathy; in setting of DKA, acidosis. UTI Improved, alert, no lethargic.  Ammonia normal.   3-Psychiatric disorder. Per family history of schizophrenia.  Per family patient has been having, visual and auditory hallucination. Paranoid, not taking meds.  Psych consulted.  Patient evaluated by TTS; recommendations are for inpatient Psych facility. Patient will need to be IVC if she doesn't agree to  go to psych facility.  Psych consulted to help with medications.   4-increase bili, Alk phosphatase; repeat labs in am.  trending down.   5-UTI; UA with too numerous to count WBC.  Follow urine culture ; no significant growth  Continue with ceftriaxone.   6-DM, neuropathy;  Report pain feet. Difficult to walk due to pain/  Started Gabapentin.   7-Vaginal candidiasis. Oral fluconazole.  8-Leukocytosis; repeat labs in am. Related to UTI.    DVT prophylaxis; Lovenox.  Code Status: Full code.  Family Communication: Sister who was at bedside 4-30  Disposition Plan: needs to Inpatient psychiatric facility admission   Consultants:   Psych.    Procedures:    Antimicrobials:  Ceftriaxone 4-29  Subjective: She is feeling well, no nausea. No abdominal pain. She does not wants to go to [facility], wants to go to SNF  Objective: Vitals:   08/21/16 0400 08/21/16 0500 08/21/16 0700 08/21/16 0755  BP: 125/84 114/87 109/72   Pulse: 91 93 88 89  Resp: '15 13 17 19  ' Temp: 99.9 F (37.7 C)     TempSrc: Oral     SpO2: 99% 99% 99% 100%  Weight:  65 kg (143 lb 4.8 oz)    Height:        Intake/Output Summary (Last 24 hours) at 08/21/16 0836 Last data filed at 08/20/16 2300  Gross per 24 hour  Intake              530 ml  Output  1575 ml  Net            -1045 ml   Filed Weights   08/19/16 1559 08/20/16 0500 08/21/16 0500  Weight: 62.5 kg (137 lb 12.6 oz) 63.4 kg (139 lb 12.4 oz) 65 kg (143 lb 4.8 oz)    Examination:  General exam: NAD Respiratory system: Clear to auscultation. Respiratory effort normal. Cardiovascular system: S1 & S2 heard, RRR. No JVD, murmurs, rubs, gallops or clicks. No pedal edema. Gastrointestinal system: soft, NT, ND, no organomegaly or masses felt. Normal bowel sounds heard. Central nervous system: Alert and oriented. No focal neurological deficits. Extremities: Symmetric 5 x 5 power. Skin: no rash      Data Reviewed: I have personally  reviewed following labs and imaging studies  CBC:  Recent Labs Lab 08/19/16 1220 08/21/16 0527  WBC 17.2* 10.0  NEUTROABS 14.7*  --   HGB 11.4* 10.2*  HCT 35.0* 30.8*  MCV 80.5 78.6  PLT 430* 333   Basic Metabolic Panel:  Recent Labs Lab 08/19/16 1918 08/19/16 2327 08/20/16 0458 08/20/16 1301 08/21/16 0527  NA 138 139 137 136 136  K 3.8 3.4* 4.5 4.5 3.5  CL 106 109 107 104 106  CO2 20* 23 18* 20* 18*  GLUCOSE 269* 156* 276* 422* 347*  BUN '14 14 14 16 14  ' CREATININE 1.02* 0.79 0.90 0.97 0.77  CALCIUM 8.8* 9.0 9.0 9.2 8.6*   GFR: Estimated Creatinine Clearance: 83.6 mL/min (by C-G formula based on SCr of 0.77 mg/dL). Liver Function Tests:  Recent Labs Lab 08/19/16 1220 08/20/16 0458  AST 8* 10*  ALT 11* 9*  ALKPHOS 169* 135*  BILITOT 1.5* 0.7  PROT 8.7* 7.5  ALBUMIN 3.2* 2.6*   No results for input(s): LIPASE, AMYLASE in the last 168 hours.  Recent Labs Lab 08/19/16 1515  AMMONIA <9*   Coagulation Profile: No results for input(s): INR, PROTIME in the last 168 hours. Cardiac Enzymes: No results for input(s): CKTOTAL, CKMB, CKMBINDEX, TROPONINI in the last 168 hours. BNP (last 3 results) No results for input(s): PROBNP in the last 8760 hours. HbA1C: No results for input(s): HGBA1C in the last 72 hours. CBG:  Recent Labs Lab 08/20/16 1142 08/20/16 1608 08/20/16 1733 08/20/16 2130 08/21/16 0722  GLUCAP 324* 259* 294* 262* 300*   Lipid Profile: No results for input(s): CHOL, HDL, LDLCALC, TRIG, CHOLHDL, LDLDIRECT in the last 72 hours. Thyroid Function Tests: No results for input(s): TSH, T4TOTAL, FREET4, T3FREE, THYROIDAB in the last 72 hours. Anemia Panel: No results for input(s): VITAMINB12, FOLATE, FERRITIN, TIBC, IRON, RETICCTPCT in the last 72 hours. Sepsis Labs: No results for input(s): PROCALCITON, LATICACIDVEN in the last 168 hours.  Recent Results (from the past 240 hour(s))  MRSA PCR Screening     Status: None   Collection Time:  08/19/16  4:00 PM  Result Value Ref Range Status   MRSA by PCR NEGATIVE NEGATIVE Final    Comment:        The GeneXpert MRSA Assay (FDA approved for NASAL specimens only), is one component of a comprehensive MRSA colonization surveillance program. It is not intended to diagnose MRSA infection nor to guide or monitor treatment for MRSA infections.          Radiology Studies: Dg Chest 2 View  Result Date: 08/19/2016 CLINICAL DATA:  Altered mental status EXAM: CHEST  2 VIEW COMPARISON:  None. FINDINGS: The heart size and mediastinal contours are within normal limits. Both lungs are clear. The visualized skeletal  structures are unremarkable. IMPRESSION: No active cardiopulmonary disease. Electronically Signed   By: Kathreen Devoid   On: 08/19/2016 12:55        Scheduled Meds: . clotrimazole   Topical BID  . enoxaparin (LOVENOX) injection  40 mg Subcutaneous Q24H  . fluconazole  100 mg Oral Daily  . gabapentin  100 mg Oral TID  . insulin aspart  0-15 Units Subcutaneous TID WC  . insulin aspart  6 Units Subcutaneous TID WC  . insulin glargine  20 Units Subcutaneous BID  . multivitamin with minerals  1 tablet Oral Daily   Continuous Infusions: . sodium chloride    . cefTRIAXone (ROCEPHIN)  IV Stopped (08/20/16 1723)     LOS: 2 days    Time spent: 35 minutes.     Elmarie Shiley, MD [facility] Pager [phone]  If 7PM-7AM, please contact night-coverage [email or web address] Password [facility] 08/21/2016, 8:36 AM

## 2016-08-21 NOTE — Clinical Social Work Note (Signed)
Patient is currently agreeable to voluntary psychiatric inpatient placement once she is medically stable.   Patient continues to make statements that are paranoid in nature.   Patient may change her mind regarding voluntary psychiatric placement at which point LCSW will complete IVC should she still meet inpatient status.       Tretha Sciara D
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
## 2016-08-21 NOTE — Consult Note (Addendum)
   [facility] CM Inpatient Consult   08/21/2016  Kellie Pearson [id number]   Patient screened for potential [facility] Care Management services. Patient is eligible for [facility] Services. Spoke with inpatient case manager who stated patient's discharge plan is yet to be determined and [facility] Care Management services not appropriate at this time. Will make MD office aware of patient's eligibility so they can refer patient to [facility] as an outpatient if they feel appropriate. If patient's post hospital needs change please place a [facility] Care Management consult. For questions please contact:   Anwar Sakata RN, BSN [facility] Liaison  [phone]) Business Mobile [phone]) Toll free office

## 2016-08-22 DIAGNOSIS — E1110 Type 2 diabetes mellitus with ketoacidosis without coma: Secondary | ICD-10-CM

## 2016-08-22 DIAGNOSIS — E1011 Type 1 diabetes mellitus with ketoacidosis with coma: Secondary | ICD-10-CM

## 2016-08-22 DIAGNOSIS — N390 Urinary tract infection, site not specified: Secondary | ICD-10-CM

## 2016-08-22 LAB — BASIC METABOLIC PANEL
ANION GAP: 6 (ref 5–15)
BUN: 13 mg/dL (ref 6–20)
CALCIUM: 8.3 mg/dL — AB (ref 8.9–10.3)
CO2: 24 mmol/L (ref 22–32)
Chloride: 107 mmol/L (ref 101–111)
Creatinine, Ser: 0.52 mg/dL (ref 0.44–1.00)
GFR calc Af Amer: >60 mL/min (ref >60)
GFR calc non Af Amer: >60 mL/min (ref >60)
GLUCOSE: 210 mg/dL — AB (ref 65–99)
POTASSIUM: 3.2 mmol/L — AB (ref 3.5–5.1)
SODIUM: 137 mmol/L (ref 135–145)

## 2016-08-22 LAB — GLUCOSE, CAPILLARY
GLUCOSE-CAPILLARY: 159 mg/dL — AB (ref 65–99)
Glucose-Capillary: 156 mg/dL — ABNORMAL HIGH (ref 65–99)
Glucose-Capillary: 123 mg/dL — ABNORMAL HIGH (ref 65–99)
Glucose-Capillary: 131 mg/dL — ABNORMAL HIGH (ref 65–99)

## 2016-08-22 LAB — CBC
HCT: 28.1 % — ABNORMAL LOW (ref 36.0–46.0)
Hemoglobin: 9.4 g/dL — ABNORMAL LOW (ref 12.0–15.0)
MCH: 26.1 pg (ref 26.0–34.0)
MCHC: 33.5 g/dL (ref 30.0–36.0)
MCV: 78.1 fL (ref 78.0–100.0)
PLATELETS: 363 10*3/uL (ref 150–400)
RBC: 3.6 MIL/uL — AB (ref 3.87–5.11)
RDW: 14 % (ref 11.5–15.5)
WBC: 10.4 10*3/uL (ref 4.0–10.5)

## 2016-08-22 MED ORDER — INSULIN ASPART PROT & ASPART (70-30 MIX) 100 UNIT/ML ~~LOC~~ SUSP: 23.0000 [IU] | Freq: Two times a day (BID) | SUBCUTANEOUS | 11 refills | Status: DC

## 2016-08-22 NOTE — Progress Notes (Signed)
Inpatient Diabetes Program Recommendations  AACE/ADA: New Consensus Statement on Inpatient Glycemic Control (2015)  Target Ranges:  Prepandial:   less than 140 mg/dL      Peak postprandial:   less than 180 mg/dL (1-2 hours)      Critically ill patients:  140 - 180 mg/dL  Results for Kellie Pearson, Kellie Pearson (MRN [id number]) as of 08/22/2016 08:10  Ref. Range 08/22/2016 05:46  Glucose Latest Ref Range: 65 - 99 mg/dL 644 (H)   Results for Kellie Pearson, Kellie Pearson (MRN [id number]) as of 08/22/2016 08:10  Ref. Range 08/21/2016 07:22 08/21/2016 11:08 08/21/2016 16:04 08/21/2016 21:33  Glucose-Capillary Latest Ref Range: 65 - 99 mg/dL 638 (H) 756 (H) 433 (H) 237 (H)   Review of Glycemic Control  Outpatient Diabetes medications: 70/30 20 units BID Current orders for Inpatient glycemic control: Lantus 25 units BID, Novolog 6 units TID with meals for meal coverage, Novolog 0-15 units TID with meals  Inpatient Diabetes Program Recommendations: Insulin - Basal: Noted Lantus was increased from 20 to 25 units BID on 08/21/16. Correction (SSI): CBG at bedtime was 237 mg/dl but no insulin given since no bedtime correction ordered. Please consider ordering Novolog 0-5 units QHS for bedtime correction scale. Insulin - Meal Coverage: Please consider increasing meal coverage to Novolog 8 units TID with meals for meal coverage. HgbA1C: Please add on an A1C to blood in lab if available or draw with next scheduled blood draw to evaluate glycemic control over the past 2-3 months.   NOTE: Noted in reviewing chart that patient has not been taking insulin and " They (patient's mother and sister) are very concern with patient safety. She has not been taking her medications. She has vision problems and has been driving. She talks out of her head, think somebody is putting something to her insulin, hear things and sees things per sister report. Family would like for patient to go to a Facility."  Per MD progress note on 08/20/16, patient will be  admitted to inpatient psychiatric facility once medically cleared.   Thanks, Orlando Penner, RN, MSN, CDE Diabetes Coordinator Inpatient Diabetes Program [phone] (Team Pager from 8am to 5pm)

## 2016-08-22 NOTE — Clinical Social Work Note (Signed)
LCSW spoke with TTS and advised that patient was medically cleared and would be going voluntarily to psychiatric placement.  TTS advised that patient they would begin seeking placement.     Mellanie Bejarano, Juleen China, LCSW

## 2016-08-22 NOTE — Consult Note (Signed)
Telepsych Consultation   Reason for Consult:  Delusional thinking Referring Physician:  EDP Patient Identification: Kellie Pearson MRN:  [id number] Principal Diagnosis: Delusional disorder [facility]) Diagnosis:   Patient Active Problem List   Diagnosis Date Noted  . Delusional disorder ([place]) [F22] 08/21/2016  . Acute lower UTI [N39.0] 08/20/2016  . Hyponatremia [E87.1] 06/15/2016  . Diabetes mellitus with complication ([place]) [H21.2] 06/15/2016  . Peripheral neuropathy [G62.9] 06/15/2016  . Homelessness [Z59.0] 06/15/2016  . Leukocytosis [D72.829] 06/15/2016  . Diabetic ketoacidosis without coma associated with type 2 diabetes mellitus ([place]) [E11.10]   . Perirectal abscess [K61.1]   . Hidradenitis suppurativa [L73.2]   . Diabetes mellitus without complication ([place]) [Y48.2]   . Increased anion gap metabolic acidosis [N00.3] 09/02/2012  . DKA (diabetic ketoacidoses) ([place]) [E13.10] 09/02/2012  . Metabolic acidosis [B04.8] 09/01/2012  . Tachycardia [R00.0] 09/01/2012  . Skin abscess [L02.91] 09/01/2012    Total Time spent with patient: 30 minutes  Subjective:   Kellie Pearson is a 51 y.o. female patient admitted with delusional thinking and diabetic ketoacidosis.  HPI:  Per tele assessment note on chart written by Ellouise Newer, [facility] Counselor:  DENICIA PAGLIARULO is an 51 y.o. female who was referred by Dr Tyrell Antonio, treating hospitalist. Pt came to [facility] with EMS after being found in DKA. Pt states, "I fell on the floor, and that is why I am here". She denies SI, HI, AVH, SA. She denies ever having any previous mental health problems or hospitalizations, IP or OP treatment. She admits to sleeping more than usual due to leg pain she states is caused by possible neuropathy. Pt's mood is slightly irritable with congruent affect and she says she wants to go home. She states that she lives with her parents because , "Apparently, the government says I have to, but I just wanted to live my best life as a  single girl to find a partner and have kids. I think I can live on my own. She states that she is her own guardian. ". Pt was cooperative, but seemed confused as to why writer was being consulted. Pt has poor insight and judgment.  ? MSE: Pt is casually dressed, alert, with normal speech and normal motor behavior.  Pt is oriented to place and self, not date. Eye contact is poor.  Thought process is coherent and relevant. There is no indication at this time that pt is currently responding to internal stimuli, but she could be experiencing delusional thought content. Pt was cooperative throughout assessment.   Collateral per Dr. Tyrell Antonio, "She is accompanied by her sister. Patient currently lives with her mother and sister. They are very concern with patient safety. She has not been taking her medications. She has vision problems and has been driving. She talks out of her head, think somebody is putting something to her insulin, hear things and sees things per sister report. Family would like for patient to go to a facility".  Today during tele psych consult:  I have reviewed and concur with HPI elements above, modified as follows:   Kellie Pearson is a 51 year old female who presented to the [facility] after falling on the floor at home. Pt was in diabetic ketoacidosis Today, Pt denies suicidal/homicidal ideation, denies auditory/visual hallucinations and does not appear to be responding to internal stimuli. Pt was calm and cooperative, alert & oriented x 4, lying in the hospital bed. Pt states "I live at the home and I want my own apartment and  space but the government says I have to live with my mother. I am not answering all of your questions again. I think this was an attack by the enemy to bring my body down and I am not interested in therapy. I was taking my insulin, it just wasn't enough. I had a PCP that would not return my calls so I came to the hospital when my leg gave out and I fell. I have a vision  problem but I still drive. I went to get glasses a week ago so I can drive if I want to." Pt denies any mental health history but was admitted to [facility] in April 2004, notes precede EPIC EMR records, unable to determine mental health diagnosis.    Based on collateral gathered by Ellouise Newer, [facility] Counselor; The pt's family states she is paranoid and thinks someone is putting something in her insulin. Pt does not take her meds and she appears to be having increased depression. Pt's sister stated the Pt hears and sees things that aren't there. She needs help but is good at saying all the right things to make people believe she is ok. She had mental health help in the past but stopped taking the medication.   Past Psychiatric History: Not in file  Risk to Self: Suicidal Ideation: No Suicidal Intent: No Is patient at risk for suicide?: No Suicidal Plan?: No Access to Means: No What has been your use of drugs/alcohol within the last 12 months?:  (denies) Intentional Self Injurious Behavior: None Risk to Others: Homicidal Ideation: No Thoughts of Harm to Others: No Current Homicidal Intent: No Current Homicidal Plan: No Access to Homicidal Means: No History of harm to others?: No Assessment of Violence: None Noted Does patient have access to weapons?: No Criminal Charges Pending?: No Does patient have a court date: No Prior Inpatient Therapy: Prior Inpatient Therapy:  (pt denies) Prior Therapy Dates:  (denies) Prior Therapy Facilty/Provider(s):  (denies) Reason for Treatment:  (denies) Prior Outpatient Therapy: Prior Outpatient Therapy:  (denies) Prior Therapy Dates:  (denies) Prior Therapy Facilty/Provider(s):  (denies) Reason for Treatment:  (denies) Does patient have an ACCT team?: No Does patient have Intensive In-House Services?  : No Does patient have [facility] services? : No Does patient have P4CC services?: No  Past Medical History:  Past Medical History:  Diagnosis Date  . Abscess     admitted 5/14  . Diabetes mellitus without complication ([place])   . Hidradenitis suppurativa     Past Surgical History:  Procedure Laterality Date  . BACK SURGERY     Family History:  Family History  Problem Relation Age of Onset  . Diabetes Mother   . Diabetes Father    Family Psychiatric  History: Unknown Social History:  History  Alcohol Use No     History  Drug Use No    Social History   Social History  . Marital status: Single    Spouse name: N/A  . Number of children: N/A  . Years of education: N/A   Social History Main Topics  . Smoking status: Former Research scientist (life sciences)  . Smokeless tobacco: Never Used  . Alcohol use No  . Drug use: No  . Sexual activity: No   Other Topics Concern  . None   Social History Narrative  . None   Additional Social History:    Allergies:  No Known Allergies  Labs:  Results for orders placed or performed during the hospital encounter of 08/19/16 (from the  past 48 hour(s))  Glucose, capillary     Status: Abnormal   Collection Time: 08/20/16 11:42 AM  Result Value Ref Range   Glucose-Capillary 324 (H) 65 - 99 mg/dL  Basic metabolic panel     Status: Abnormal   Collection Time: 08/20/16  1:01 PM  Result Value Ref Range   Sodium 136 135 - 145 mmol/L   Potassium 4.5 3.5 - 5.1 mmol/L   Chloride 104 101 - 111 mmol/L   CO2 20 (L) 22 - 32 mmol/L   Glucose, Bld 422 (H) 65 - 99 mg/dL   BUN 16 6 - 20 mg/dL   Creatinine, Ser 0.97 0.44 - 1.00 mg/dL   Calcium 9.2 8.9 - 10.3 mg/dL   GFR calc non Af Amer >60 >60 mL/min   GFR calc Af Amer >60 >60 mL/min    Comment: (NOTE) The eGFR has been calculated using the CKD EPI equation. This calculation has not been validated in all clinical situations. eGFR's persistently <60 mL/min signify possible Chronic Kidney Disease.    Anion gap 12 5 - 15  Glucose, capillary     Status: Abnormal   Collection Time: 08/20/16  4:08 PM  Result Value Ref Range   Glucose-Capillary 259 (H) 65 - 99 mg/dL   Glucose, capillary     Status: Abnormal   Collection Time: 08/20/16  5:33 PM  Result Value Ref Range   Glucose-Capillary 294 (H) 65 - 99 mg/dL  Glucose, capillary     Status: Abnormal   Collection Time: 08/20/16  9:30 PM  Result Value Ref Range   Glucose-Capillary 262 (H) 65 - 99 mg/dL  CBC     Status: Abnormal   Collection Time: 08/21/16  5:27 AM  Result Value Ref Range   WBC 10.0 4.0 - 10.5 K/uL   RBC 3.92 3.87 - 5.11 MIL/uL   Hemoglobin 10.2 (L) 12.0 - 15.0 g/dL   HCT 30.8 (L) 36.0 - 46.0 %   MCV 78.6 78.0 - 100.0 fL   MCH 26.0 26.0 - 34.0 pg   MCHC 33.1 30.0 - 36.0 g/dL   RDW 14.0 11.5 - 15.5 %   Platelets 361 150 - 400 K/uL  Basic metabolic panel     Status: Abnormal   Collection Time: 08/21/16  5:27 AM  Result Value Ref Range   Sodium 136 135 - 145 mmol/L   Potassium 3.5 3.5 - 5.1 mmol/L    Comment: DELTA CHECK NOTED   Chloride 106 101 - 111 mmol/L   CO2 18 (L) 22 - 32 mmol/L   Glucose, Bld 347 (H) 65 - 99 mg/dL   BUN 14 6 - 20 mg/dL   Creatinine, Ser 0.77 0.44 - 1.00 mg/dL   Calcium 8.6 (L) 8.9 - 10.3 mg/dL   GFR calc non Af Amer >60 >60 mL/min   GFR calc Af Amer >60 >60 mL/min    Comment: (NOTE) The eGFR has been calculated using the CKD EPI equation. This calculation has not been validated in all clinical situations. eGFR's persistently <60 mL/min signify possible Chronic Kidney Disease.    Anion gap 12 5 - 15  Glucose, capillary     Status: Abnormal   Collection Time: 08/21/16  7:22 AM  Result Value Ref Range   Glucose-Capillary 300 (H) 65 - 99 mg/dL  Glucose, capillary     Status: Abnormal   Collection Time: 08/21/16 11:08 AM  Result Value Ref Range   Glucose-Capillary 373 (H) 65 - 99 mg/dL   Comment  1 Notify RN    Comment 2 Document in Chart   Glucose, capillary     Status: Abnormal   Collection Time: 08/21/16  4:04 PM  Result Value Ref Range   Glucose-Capillary 202 (H) 65 - 99 mg/dL   Comment 1 Notify RN    Comment 2 Document in Chart   Glucose,  capillary     Status: Abnormal   Collection Time: 08/21/16  9:33 PM  Result Value Ref Range   Glucose-Capillary 237 (H) 65 - 99 mg/dL  CBC     Status: Abnormal   Collection Time: 08/22/16  5:46 AM  Result Value Ref Range   WBC 10.4 4.0 - 10.5 K/uL   RBC 3.60 (L) 3.87 - 5.11 MIL/uL   Hemoglobin 9.4 (L) 12.0 - 15.0 g/dL   HCT 28.1 (L) 36.0 - 46.0 %   MCV 78.1 78.0 - 100.0 fL   MCH 26.1 26.0 - 34.0 pg   MCHC 33.5 30.0 - 36.0 g/dL   RDW 14.0 11.5 - 15.5 %   Platelets 363 150 - 400 K/uL  Basic metabolic panel     Status: Abnormal   Collection Time: 08/22/16  5:46 AM  Result Value Ref Range   Sodium 137 135 - 145 mmol/L   Potassium 3.2 (L) 3.5 - 5.1 mmol/L   Chloride 107 101 - 111 mmol/L   CO2 24 22 - 32 mmol/L   Glucose, Bld 210 (H) 65 - 99 mg/dL   BUN 13 6 - 20 mg/dL   Creatinine, Ser 0.52 0.44 - 1.00 mg/dL   Calcium 8.3 (L) 8.9 - 10.3 mg/dL   GFR calc non Af Amer >60 >60 mL/min   GFR calc Af Amer >60 >60 mL/min    Comment: (NOTE) The eGFR has been calculated using the CKD EPI equation. This calculation has not been validated in all clinical situations. eGFR's persistently <60 mL/min signify possible Chronic Kidney Disease.    Anion gap 6 5 - 15  Glucose, capillary     Status: Abnormal   Collection Time: 08/22/16  8:28 AM  Result Value Ref Range   Glucose-Capillary 159 (H) 65 - 99 mg/dL    Current Facility-Administered Medications  Medication Dose Route Frequency Provider Last Rate Last Dose  . 0.9 %  sodium chloride infusion   Intravenous Continuous Belkys A Regalado, MD 125 mL/hr at 08/22/16 0124    . cefTRIAXone (ROCEPHIN) 1 g in dextrose 5 % 50 mL IVPB  1 g Intravenous Q24H Elmarie Shiley, MD   Stopped at 08/21/16 1715  . clotrimazole (LOTRIMIN) 1 % cream   Topical BID Belkys A Regalado, MD      . enoxaparin (LOVENOX) injection 40 mg  40 mg Subcutaneous Q24H Belkys A Regalado, MD   40 mg at 08/21/16 1645  . fluconazole (DIFLUCAN) tablet 100 mg  100 mg Oral Daily  Belkys A Regalado, MD   100 mg at 08/21/16 1050  . gabapentin (NEURONTIN) capsule 100 mg  100 mg Oral TID Belkys A Regalado, MD   100 mg at 08/21/16 2301  . insulin aspart (novoLOG) injection 0-15 Units  0-15 Units Subcutaneous TID WC Elmarie Shiley, MD   3 Units at 08/22/16 0833  . insulin aspart (novoLOG) injection 6 Units  6 Units Subcutaneous TID WC Belkys A Regalado, MD   6 Units at 08/22/16 0833  . insulin glargine (LANTUS) injection 25 Units  25 Units Subcutaneous BID Elmarie Shiley, MD   25 Units at 08/21/16  2302  . multivitamin with minerals tablet 1 tablet  1 tablet Oral Daily Belkys A Regalado, MD   1 tablet at 08/21/16 1050    Musculoskeletal: Unable to assess: camera  Psychiatric Specialty Exam: Physical Exam  Review of Systems  Psychiatric/Behavioral: Positive for depression. Negative for hallucinations, memory loss, substance abuse and suicidal ideas. The patient is not nervous/anxious and does not have insomnia.     Blood pressure 117/68, pulse 86, temperature 99 F (37.2 C), temperature source Oral, resp. rate 15, height '5\' 7"'  (1.702 m), weight 67.5 kg (148 lb 12.8 oz), last menstrual period 07/26/2016, SpO2 99 %.Body mass index is 23.31 kg/m.  General Appearance: Casual  Eye Contact:  Fair  Speech:  Garbled and Pressured  Volume:  Normal  Mood:  Anxious and Irritable  Affect:  Congruent  Thought Process:  Coherent and Linear  Orientation:  Full (Time, Place, and Person)  Thought Content:  Delusions  Suicidal Thoughts:  No  Homicidal Thoughts:  No  Memory:  Immediate;   Good Recent;   Fair Remote;   Fair  Judgement:  Fair  Insight:  Lacking  Psychomotor Activity:  Normal  Concentration:  Concentration: Fair and Attention Span: Fair  Recall:  [employer] of Knowledge:  Fair  Language:  Fair  Akathisia:  No  Handed:  Right  AIMS (if indicated):     Assets:  Agricultural consultant Housing Resilience Social  Support Transportation  ADL's:  Intact  Cognition:  WNL  Sleep:        Treatment Plan Summary: Daily contact with patient to assess and evaluate symptoms and progress in treatment and Medication management  Disposition: Recommend psychiatric Inpatient admission when medically cleared.  Ethelene Hal, NP 08/22/2016 9:19 AM

## 2016-08-22 NOTE — Discharge Summary (Signed)
Physician Discharge Summary  Kellie Pearson:[id number] DOB: 03/17/1966 DOA: 08/19/2016  PCP: Leo Grosser, MD  Admit date: 08/19/2016 Discharge date: 08/22/2016  Time spent: 45 minutes  Recommendations for Outpatient Follow-up:  -Will be discharged to [facility] for continued inpatient psychiatric care. She is going voluntarily. -Advised follow up with PCP within 1 week after DC from [facility] for continued diabetic care.   Discharge Diagnoses:  Principal Problem:   Delusional disorder [facility]) Active Problems:   Metabolic acidosis   DKA (diabetic ketoacidoses) (H[facility])   Diabetes mellitus with complication ([facility])   Acute lower UTI   Discharge Condition: Improved  Filed Weights   08/20/16 0500 08/21/16 0500 08/21/16 1328  Weight: 63.4 kg (139 lb 12.4 oz) 65 kg (143 lb 4.8 oz) 67.5 kg (148 lb 12.8 oz)    History of present illness:  As per Dr. Sunnie Pearson on 5/29: Kellie Pearson is a 51 y.o. female with medical history significant of  DM, DKA, Peri-rectal abscess, who presents with generalized weakness, she relates that she collapse today. She has not been able to walk, feels tired. She report generalized pain, and pain in her feet. She denies rectal pain or any skin infection. No cough, or abdominal pain.   She is accompanied by her sister. Patient currently lives with her mother and sister. They are very concern with patient safety. She has not been taking her medications. She has vision problems and has been driving. She talks out of her head, think somebody is putting something to her insulin, hear things and sees things per sister report. Family would like for patient to go to a  Facility.   ED Course: Patient presents with AMS, found to be in DKA, Gap; 25, PH 7.1, bicarb 12, glucose 590, Alkaline phosphatase 169,bili 1.5, cr 1.3, WBC 17, chest x ray; no active cardiopulmonary diseases.   Hospital Course:   DKA -Resolved. -Due to patient not adequately taking her insulin due to her  mental health problems (per family she hears and sees things and thinks people are putting "bad stuff" in her insulin). -Will DC on 70/30 as she has difficulty affording lantus.  Psychiatric Disorder (?schizophrenia) -Seen by psychiatry with recommendations for inpatient psych care. -Patient is willing to go voluntarily at this time.  Acute Metabolic Encephalopathy -At baseline. -Likely from UTI/DKA on top of baseline mental health issues.  ?UTI -Urine cx negative. -Abx will be discontinued.   Vaginal Candidiasis -Has completed 3 days of fluconazole.  Procedures:  None   Consultations:  Psychiatry  Discharge Instructions  Discharge Instructions    Diet Carb Modified    Complete by:  As directed    Increase activity slowly    Complete by:  As directed      Allergies as of 08/22/2016   No Known Allergies     Medication List    STOP taking these medications   clindamycin 300 MG capsule Commonly known as:  CLEOCIN     TAKE these medications   insulin aspart protamine- aspart (70-30) 100 UNIT/ML injection Commonly known as:  NOVOLOG MIX 70/30 Inject 0.23 mLs (23 Units total) into the skin 2 (two) times daily with a meal. What changed:  how much to take   multivitamin with minerals Tabs tablet Take 1 tablet by mouth daily.      No Known Allergies Follow-up Information    PICKARD,WARREN TOM, MD. Schedule an appointment as soon as possible for a visit in 2 week(s).   Specialty:  Family Medicine Contact information: [address] [place] [address] [phone]            The results of significant diagnostics from this hospitalization (including imaging, microbiology, ancillary and laboratory) are listed below for reference.    Significant Diagnostic Studies: Dg Chest 2 View  Result Date: 08/19/2016 CLINICAL DATA:  Altered mental status EXAM: CHEST  2 VIEW COMPARISON:  None. FINDINGS: The heart size and mediastinal contours are within  normal limits. Both lungs are clear. The visualized skeletal structures are unremarkable. IMPRESSION: No active cardiopulmonary disease. Electronically Signed   By: Elige Ko   On: 08/19/2016 12:55    Microbiology: Recent Results (from the past 240 hour(s))  Urine culture     Status: Abnormal   Collection Time: 08/19/16  4:00 PM  Result Value Ref Range Status   Specimen Description URINE, CLEAN CATCH  Final   Special Requests NONE  Final   Culture (A)  Final    <10,000 COLONIES/mL INSIGNIFICANT GROWTH Performed at [facility], [address], [place], [place] [address]    Report Status 08/21/2016 FINAL  Final  MRSA PCR Screening     Status: None   Collection Time: 08/19/16  4:00 PM  Result Value Ref Range Status   MRSA by PCR NEGATIVE NEGATIVE Final    Comment:        The GeneXpert MRSA Assay (FDA approved for NASAL specimens only), is one component of a comprehensive MRSA colonization surveillance program. It is not intended to diagnose MRSA infection nor to guide or monitor treatment for MRSA infections.      Labs: Basic Metabolic Panel:  Recent Labs Lab 08/19/16 2327 08/20/16 0458 08/20/16 1301 08/21/16 0527 08/22/16 0546  NA 139 137 136 136 137  K 3.4* 4.5 4.5 3.5 3.2*  CL 109 107 104 106 107  CO2 23 18* 20* 18* 24  GLUCOSE 156* 276* 422* 347* 210*  BUN CREATININE 0.79 0.90 0.97 0.77 0.52  CALCIUM 9.0 9.0 9.2 8.6* 8.3*   Liver Function Tests:  Recent Labs Lab 08/19/16 1220 08/20/16 0458  AST 8* 10*  ALT 11* 9*  ALKPHOS 169* 135*  BILITOT 1.5* 0.7  PROT 8.7* 7.5  ALBUMIN 3.2* 2.6*   No results for input(s): LIPASE, AMYLASE in the last 168 hours.  Recent Labs Lab 08/19/16 1515  AMMONIA <9*   CBC:  Recent Labs Lab 08/19/16 1220 08/21/16 0527 08/22/16 0546  WBC 17.2* 10.0 10.4  NEUTROABS 14.7*  --   --   HGB 11.4* 10.2* 9.4*  HCT 35.0* 30.8* 28.1*  MCV 80.5 78.6 78.1  PLT 430* 361 363   Cardiac Enzymes: No  results for input(s): CKTOTAL, CKMB, CKMBINDEX, TROPONINI in the last 168 hours. BNP: BNP (last 3 results) No results for input(s): BNP in the last 8760 hours.  ProBNP (last 3 results) No results for input(s): PROBNP in the last 8760 hours.  CBG:  Recent Labs Lab 08/21/16 1108 08/21/16 1604 08/21/16 2133 08/22/16 0828 08/22/16 1226  GLUCAP 373* 202* 237* 159* 156*       Signed:  HERNANDEZ ACOSTA,Razan Siler  [facility] Pager: [phone] 08/22/2016, 1:34 PM

## 2016-08-22 NOTE — Progress Notes (Signed)
Patient c/o small lump to right underarm. Patient denies pain with lump but wants information passed on for doctor in AM.

## 2016-08-23 LAB — GLUCOSE, CAPILLARY
GLUCOSE-CAPILLARY: 57 mg/dL — AB (ref 65–99)
GLUCOSE-CAPILLARY: 98 mg/dL (ref 65–99)
GLUCOSE-CAPILLARY: 74 mg/dL (ref 65–99)
Glucose-Capillary: 226 mg/dL — ABNORMAL HIGH (ref 65–99)
Glucose-Capillary: 148 mg/dL — ABNORMAL HIGH (ref 65–99)

## 2016-08-23 MED ORDER — INSULIN GLARGINE 100 UNIT/ML ~~LOC~~ SOLN
12.0000 [IU] | Freq: Two times a day (BID) | SUBCUTANEOUS | Status: DC
  Administered 2016-08-24 – 2016-08-25 (×3): 12 [IU] via SUBCUTANEOUS
  Filled 2016-08-23 (×7): qty 0.12

## 2016-08-23 NOTE — [facility] Counselor (Signed)
Reassessment- Pt denies SI/HI and AVH.  TTS will continue to seek placement.-  Kellie Pearson, [facility] Triage Specialist

## 2016-08-23 NOTE — Progress Notes (Signed)
Patient briefly seen and examined. Has no new complaints. Is awaiting bed at an inpatient psych facility. She has been medically cleared for DC.  Peggye PittEstela Hernandez, MD [facility] Pager: [phone]

## 2016-08-23 NOTE — Progress Notes (Addendum)
Referral was followed up at the following inpatient treatment facilities:  [facility] - spoke with Clydie BraunKaren, stated that to refax referral. [facility] - intake reviewing referral. [facility] - intake Desirae advised that referral is under review (has been received) and someone will contact if they had bed opening.  Referral was also sent to the following facilities: [facility], [facility], [facility], [facility], and [facility].  At [facility]: [facility], [facility], and [facility].  CSW in disposition will continue to follow up and seek placement.  [place] Abtsatia Keyonia Gluth, LCSWA Disposition staff 08/23/2016 7:02 PM

## 2016-08-23 NOTE — Progress Notes (Signed)
Pt meets criteria for inpatient admission; CSW faxed referral packet to the following facilities in attempts to secure inpatient bed:   [facility]  TTS will continue to seek placement.   Vernie ShanksLauren Deanthony Maull, LCSW Clinical Social Work [phone]

## 2016-08-23 NOTE — Clinical Social Work Note (Signed)
LCSW spoke with Lauren in disposition.  Lauren stated that she would be faxing patient out to facilities this morning she she was medically cleared.   Kapono Luhn, [name], LCSW

## 2016-08-23 NOTE — Clinical Social Work Note (Signed)
LCSW spoke with [name] in disposition.  Patient has been referred and [name] stated that she will follow up on bed availability.        Kellie Pearson, [name], LCSW

## 2016-08-23 NOTE — Progress Notes (Signed)
Patient was [facility].  Lannette BenceJohnathan Bass with [facility] is requesting patient's UDS and if the results can be [facility] at fax#: [phone].  [facility] RN Herbert SetaHeather was notified.  [place] Abtsatia Ahmaya Ostermiller, LCSWA Disposition staff 08/23/2016 10:25 PM

## 2016-08-23 NOTE — Progress Notes (Signed)
Inpatient Diabetes Program Recommendations  AACE/ADA: New Consensus Statement on Inpatient Glycemic Control (2015)  Target Ranges:  Prepandial:   less than 140 mg/dL      Peak postprandial:   less than 180 mg/dL (1-2 hours)      Critically ill patients:  140 - 180 mg/dL   Results for Kellie Pearson, Kellie Pearson (MRN [id number]) as of 08/23/2016 07:48  Ref. Range 08/22/2016 08:28 08/22/2016 12:26 08/22/2016 16:53 08/22/2016 21:29 08/23/2016 07:42  Glucose-Capillary Latest Ref Range: 65 - 99 mg/dL 629159 (H) 528156 (H) 413123 (H) 131 (H) 57 (L)   Review of Glycemic Control  Outpatient Diabetes medications: 70/30 20 units BID Current orders for Inpatient glycemic control:Lantus 25 units BID, Novolog 6 units TID with meals for meal coverage, Novolog 0-15 units TID with meals  Inpatient Diabetes Program Recommendations: Insulin - Basal: Fasting glucose 57 mg/dl this morning. Please consider decreasing bedtime dose of Lantus to 20 units (leave morning dose of Lantus to 25 units QAM). Correction (SSI): Please consider ordering Novolog 0-5 units QHS for bedtime correction scale.  Thanks, Orlando PennerMarie Eldean Nanna, RN, MSN, CDE Diabetes Coordinator Inpatient Diabetes Program [phone] (Team Pager from 8am to 5pm)

## 2016-08-24 LAB — GLUCOSE, CAPILLARY
GLUCOSE-CAPILLARY: 132 mg/dL — AB (ref 65–99)
GLUCOSE-CAPILLARY: 133 mg/dL — AB (ref 65–99)
GLUCOSE-CAPILLARY: 141 mg/dL — AB (ref 65–99)
GLUCOSE-CAPILLARY: 161 mg/dL — AB (ref 65–99)
GLUCOSE-CAPILLARY: 75 mg/dL (ref 65–99)
Glucose-Capillary: 47 mg/dL — ABNORMAL LOW (ref 65–99)
Glucose-Capillary: 122 mg/dL — ABNORMAL HIGH (ref 65–99)
Glucose-Capillary: 171 mg/dL — ABNORMAL HIGH (ref 65–99)

## 2016-08-24 LAB — RAPID URINE DRUG SCREEN, HOSP PERFORMED
Amphetamines: NOT DETECTED
Barbiturates: NOT DETECTED
Benzodiazepines: NOT DETECTED
Cocaine: NOT DETECTED
Opiates: NOT DETECTED
Tetrahydrocannabinol: NOT DETECTED

## 2016-08-24 NOTE — Progress Notes (Signed)
Patient seen and examined. Medical issues are stable. Still awaiting inpatient psychiatric care.  Peggye PittEstela Hernandez, MD [facility] Pager: [phone]

## 2016-08-24 NOTE — Progress Notes (Signed)
Spoke with Christiane HaJonathan at [phone] and he reports the AM doc will look at the pt's UDS and to follow up after 12pm to discuss the disposition.   Princess BruinsAquicha Alda Gaultney, MSW, LCSWA CSW Disposition [phone]

## 2016-08-24 NOTE — Progress Notes (Addendum)
Heriberto AntiguaJean Sutter (Social work) called from psych and stated pt had been released from their service and is not a candidate for inpatient and can be discharged. Dr. Antionette Charpyd paged and RN spoke with him and he was made aware. Dr[facility] Antionette Charpyd [facility] and she would have to wait until morning for her doctor to discharge her. Pt made aware.

## 2016-08-24 NOTE — Progress Notes (Signed)
Per [facility] at [facility], pt has been declined by doctor due to medical. TTS will continue to seek placement.  Princess BruinsAquicha Marwin Primmer, MSW, LCSWA CSW Disposition [phone]

## 2016-08-24 NOTE — Progress Notes (Signed)
Pt's CBG 75 before receiving 25 units scheduled Lantus, MD advised giving reduced dose Lantus of 12 units.

## 2016-08-24 NOTE — Progress Notes (Signed)
Pt's CBG 47, given 15 g carbohydrates, will recheck in 15 minutes.

## 2016-08-24 NOTE — Progress Notes (Signed)
CSW contacted pt's mother, Dalary Hollar ([phone]) at the request of [facility] floor Nurse, to explain that pt had been seen by C. Withrow, FNP, for a follow-up assessment and that the pt no longer met criteria for an inpatient placement.  Pt's mother expressed concern that the patient "may become violent" but voiced understanding stating, "I understand but the next time, I'm going to take her to the [facility]." This writer explained that the same TTS team evaluated patients with psychiatric emergencies throughout the [facility] system.  Pt's mother asked who would be discharging her and this Probation officer noted that it would be the on-call physician on the med floor that would complete the discharge and that our recommendation pertained to the pt's need to be hospitalized in an inpatient psychiatric facility.    Pt's mother wanted to know if she would be contacted when the patient was ready to be discharged and this writer assured her that the pt's nurse would contact her about discharge. Areatha Keas. Judi Cong, MSW, [place] [employer] Disposition [phone]
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
## 2016-08-24 NOTE — Progress Notes (Signed)
LCSW assisting with psych placement.  Faxed over requested information:   Kellie BenceJohnathan Pearson with [facility] is requesting patient's UDS and if the results can [facility] at fax#: [phone].   Will await review and follow up.  Deretha EmoryHannah Lashana Spang LCSW, MSW Clinical Social Work: Optician, dispensingystem [employer] Coverage for :  [phone]

## 2016-08-24 NOTE — Progress Notes (Addendum)
Per Lorene Dyhristie with [facility] pt has been declined due to medical reasons.   Per [facility]oni [facility] she did not receive the pt's referral and requests that it be re-faxed for review. Done.  Per Alecia LemmingVictor at [facility] pt was declined due to medical.   Princess BruinsAquicha Mancel Lardizabal, MSW, LCSWA CSW Disposition [phone]

## 2016-08-25 LAB — GLUCOSE, CAPILLARY
GLUCOSE-CAPILLARY: 186 mg/dL — AB (ref 65–99)
Glucose-Capillary: 50 mg/dL — ABNORMAL LOW (ref 65–99)
Glucose-Capillary: 220 mg/dL — ABNORMAL HIGH (ref 65–99)

## 2016-08-25 MED ORDER — INSULIN ASPART PROT & ASPART (70-30 MIX) 100 UNIT/ML ~~LOC~~ SUSP: 23.0000 [IU] | Freq: Two times a day (BID) | SUBCUTANEOUS | 11 refills | Status: DC

## 2016-08-25 NOTE — Progress Notes (Signed)
08/25/16 Late entry for 0925. Patient noted to have all 4 siderails up on bed. Lowered one of the side rails and discussed with patient we can only have 3 siderails up, all of them is considered a restraint and safety hazard with getting in and out of bed. Pt stated "they've been up before and I like it that way so I can lean on them when I sleep. I like them up in case I roll out of bed." discussed fall precautions/safe positioning in bed. Verbalized understanding. bedalarm on for safety, instructed to call for assistance and not attempt getting up on her own. 3 side rails up. Safety mat placed at bedside as well. Earnstine RegalAshley Remie Mathison, RN

## 2016-08-25 NOTE — Progress Notes (Signed)
Pts stated she felt like her blood sugar was low. Checked, blood sugar 50. Pt given coke, apple juice 2 packs of graham crackers and peanut butter. Will continue to monitor pts blood sugar.

## 2016-08-25 NOTE — Progress Notes (Signed)
After waiting for 3 days for inpatient psych placement, psych has now reassessed her and deemed her safe for DC home. No change to DC meds or summary as per DC Summary dated 5/2.  Peggye PittEstela Hernandez, MD [facility] Pager: [phone]

## 2016-08-29 ENCOUNTER — Encounter: Payer: Self-pay | Admitting: Pediatrics

## 2016-08-29 ENCOUNTER — Ambulatory Visit (INDEPENDENT_AMBULATORY_CARE_PROVIDER_SITE_OTHER): Payer: Medicare Other | Admitting: Pediatrics

## 2016-08-29 VITALS — BP 151/98 | HR 104 | Temp 98.7°F | Ht 67.0 in | Wt 159.8 lb

## 2016-08-29 DIAGNOSIS — E119 Type 2 diabetes mellitus without complications: Secondary | ICD-10-CM | POA: Diagnosis not present

## 2016-08-29 DIAGNOSIS — I10 Essential (primary) hypertension: Secondary | ICD-10-CM

## 2016-08-29 LAB — BAYER DCA HB A1C WAIVED: HB A1C: 13.1 % — AB (ref <7.0)

## 2016-08-29 MED ORDER — GABAPENTIN 300 MG PO CAPS: 300.0000 mg | ORAL_CAPSULE | Freq: Two times a day (BID) | ORAL | 3 refills | Status: DC

## 2016-08-29 MED ORDER — INSULIN ASPART PROT & ASPART (70-30 MIX) 100 UNIT/ML ~~LOC~~ SUSP: 23.0000 [IU] | Freq: Two times a day (BID) | SUBCUTANEOUS | 11 refills | Status: DC

## 2016-08-29 MED ORDER — ONETOUCH LANCETS MISC: 1.0000 | Freq: Four times a day (QID) | 0 refills | Status: AC

## 2016-08-29 MED ORDER — GLUCOSE BLOOD VI STRP: ORAL_STRIP | 12 refills | Status: DC

## 2016-08-29 MED ORDER — ONETOUCH VERIO W/DEVICE KIT: 1.0000 | PACK | Freq: Four times a day (QID) | 0 refills | Status: DC

## 2016-08-29 MED ORDER — GABAPENTIN 100 MG PO CAPS: 100.0000 mg | ORAL_CAPSULE | Freq: Two times a day (BID) | ORAL | 2 refills | Status: DC

## 2016-08-29 MED ORDER — LISINOPRIL 5 MG PO TABS: 5.0000 mg | ORAL_TABLET | Freq: Every day | ORAL | 3 refills | Status: DC

## 2016-08-29 NOTE — Progress Notes (Signed)
Subjective:   Patient ID: Kellie Pearson, female    DOB: 07/08/1965, 51 y.o.   MRN: [id number] CC: New Patient (Initial Visit); Diabetes; and Hospitalization Follow-up  HPI: Kellie Pearson is a 51 y.o. female presenting for New Patient (Initial Visit); Diabetes; and Hospitalization Follow-up  DM: Taking 23 u in morning and evening of 70/30 Switched from glargine in hospital due to inability to afford medicine and for simpler insulin dosing  Attempts made to place with [facility] after DKA admission. Not able to find placement initially. Reassessed after a few days, determined she was OK for discharge home. Lives with her parents. Here today alone  Pt says she has never had blood pressure problems before., Denies any use of drugs before.  UDS neg during recent admission. No CP, HA, or SOB  Does have burning in her feet sometimes Was started on gabapentin. Thinks has been helping some.  Past Medical History:  Diagnosis Date  . Abscess    admitted 5/14  . Diabetes mellitus without complication ([place])   . Hidradenitis suppurativa    Family History  Problem Relation Age of Onset  . Diabetes Mother   . Diabetes Father    Social History   Social History  . Marital status: Single    Spouse name: N/A  . Number of children: N/A  . Years of education: N/A   Social History Main Topics  . Smoking status: Former Research scientist (life sciences)  . Smokeless tobacco: Never Used  . Alcohol use No  . Drug use: No  . Sexual activity: No   Other Topics Concern  . None   Social History Narrative  . None   ROS: All systems negative other than what is in HPI  Objective:    BP (!) 151/98   Pulse (!) 104   Temp 98.7 F (37.1 C) (Oral)   Ht '5\' 7"'  (1.702 m)   Wt 159 lb 12.8 oz (72.5 kg)   BMI 25.03 kg/m   Wt Readings from Last 3 Encounters:  08/29/16 159 lb 12.8 oz (72.5 kg)  08/21/16 148 lb 12.8 oz (67.5 kg)  06/17/16 159 lb 6.3 oz (72.3 kg)    Gen: NAD, alert, cooperative with exam, NCAT EYES: EOMI, no  conjunctival injection, or no icterus ENT:  TMs pearly gray b/l, OP without erythema LYMPH: no cervical LAD CV: NRRR, normal S1/S2, no murmur, distal pulses 2+ b/l Resp: CTABL, no wheezes, normal WOB Abd: +BS, soft, NTND. no guarding or organomegaly Ext: No edema, warm Neuro: Alert and oriented Psych: child-like responses to questions, repeatedly asks similar questions, thinks she didn't have any medical problems until she was seen by doctors  Assessment & Plan:  Kellie Pearson was seen today for new patient (initial visit), diabetes and hospitalization follow-up.  Diagnoses and all orders for this visit:  Diabetes mellitus without complication ([place]) Uncontrolled A1c 13.1 Recent admission for DKA Switched to 70/30 BID during admission for simpler insulin regimen Missed this morning she says because of this appt but says otherwise has been doing well, taking twice a day -     Blood Glucose Monitoring Suppl (ONETOUCH VERIO) w/Device KIT; 1 kit by Does not apply route 4 (four) times daily. -     glucose blood (ONETOUCH VERIO) test strip; Use as instructed -     ONE TOUCH LANCETS MISC; 1 each by Does not apply route 4 (four) times daily. -     lisinopril (PRINIVIL,ZESTRIL) 5 MG tablet; Take 1 tablet (5 mg total) by  mouth daily. -     BMP8+EGFR -     CBC with Differential/Platelet -     Bayer DCA Hb A1c Waived -     insulin aspart protamine- aspart (NOVOLOG MIX 70/30) (70-30) 100 UNIT/ML injection; Inject 0.23 mLs (23 Units total) into the skin 2 (two) times daily with a meal. -     Microalbumin / creatinine urine ratio  Neuropathy Started on below in the hospital, she says too early to say if helping Will continue for now -     gabapentin (NEURONTIN) 100 MG capsule; Take 1 capsule (100 mg total) by mouth 2 (two) times daily.  Essential hypertension BPs most recently 140s per chart review Today very elevated Will start lisinopril as above  Follow up plan: Return in about 4 weeks (around  09/26/2016). Assunta Found, MD [place]

## 2016-08-29 NOTE — Patient Instructions (Addendum)
Check sugar levels every morning and evening before giving yourself the insulin  Start gabapentin 100mg  twice a day for neuropathy  Continue 23u of 70/30 insulin twice a day  If your morning blood sugars are over 200 or if any blood sugar levels are less than 70, let me know

## 2016-08-30 ENCOUNTER — Telehealth: Payer: Self-pay | Admitting: Pediatrics

## 2016-08-30 LAB — CBC WITH DIFFERENTIAL/PLATELET
BASOS ABS: 0 10*3/uL (ref 0.0–0.2)
Basos: 0 %
EOS (ABSOLUTE): 0.1 10*3/uL (ref 0.0–0.4)
Eos: 1 %
Hematocrit: 30.3 % — ABNORMAL LOW (ref 34.0–46.6)
Hemoglobin: 9.4 g/dL — ABNORMAL LOW (ref 11.1–15.9)
Immature Grans (Abs): 0 10*3/uL (ref 0.0–0.1)
Immature Granulocytes: 1 %
LYMPHS ABS: 2.4 10*3/uL (ref 0.7–3.1)
Lymphs: 29 %
MCH: 25.2 pg — ABNORMAL LOW (ref 26.6–33.0)
MCHC: 31 g/dL — ABNORMAL LOW (ref 31.5–35.7)
MCV: 81 fL (ref 79–97)
MONOCYTES: 8 %
MONOS ABS: 0.7 10*3/uL (ref 0.1–0.9)
NEUTROS PCT: 61 %
Neutrophils Absolute: 5.2 10*3/uL (ref 1.4–7.0)
PLATELETS: 486 10*3/uL — AB (ref 150–379)
RBC: 3.73 x10E6/uL — AB (ref 3.77–5.28)
RDW: 15.5 % — AB (ref 12.3–15.4)
WBC: 8.5 10*3/uL (ref 3.4–10.8)

## 2016-08-30 LAB — BMP8+EGFR
BUN / CREAT RATIO: 17 (ref 9–23)
BUN: 11 mg/dL (ref 6–24)
CHLORIDE: 99 mmol/L (ref 96–106)
CO2: 29 mmol/L (ref 18–29)
Calcium: 9.3 mg/dL (ref 8.7–10.2)
Creatinine, Ser: 0.63 mg/dL (ref 0.57–1.00)
GFR calc Af Amer: 122 mL/min/{1.73_m2} (ref >59)
GFR calc non Af Amer: 106 mL/min/{1.73_m2} (ref >59)
GLUCOSE: 197 mg/dL — AB (ref 65–99)
Potassium: 4.8 mmol/L (ref 3.5–5.2)
SODIUM: 140 mmol/L (ref 134–144)

## 2016-08-30 NOTE — Telephone Encounter (Signed)
lmtcb

## 2016-08-31 NOTE — Telephone Encounter (Signed)
Patient aware of labs and verbalizes understanding.  

## 2016-09-01 ENCOUNTER — Other Ambulatory Visit: Payer: Self-pay | Admitting: *Deleted

## 2016-09-01 DIAGNOSIS — E119 Type 2 diabetes mellitus without complications: Secondary | ICD-10-CM

## 2016-09-01 MED ORDER — GLUCOSE BLOOD VI STRP: ORAL_STRIP | 5 refills | Status: DC

## 2016-09-03 ENCOUNTER — Other Ambulatory Visit: Payer: Self-pay

## 2016-09-03 DIAGNOSIS — E119 Type 2 diabetes mellitus without complications: Secondary | ICD-10-CM

## 2016-09-03 MED ORDER — GLUCOSE BLOOD VI STRP: ORAL_STRIP | 5 refills | Status: DC

## 2016-09-26 ENCOUNTER — Ambulatory Visit: Payer: Medicare Other | Admitting: Pediatrics

## 2016-09-27 ENCOUNTER — Encounter: Payer: Self-pay | Admitting: Pediatrics

## 2016-10-01 ENCOUNTER — Encounter: Payer: Self-pay | Admitting: Pediatrics

## 2016-10-01 ENCOUNTER — Ambulatory Visit (INDEPENDENT_AMBULATORY_CARE_PROVIDER_SITE_OTHER): Payer: Medicare Other | Admitting: Pediatrics

## 2016-10-01 VITALS — BP 124/78 | HR 97 | Temp 97.6°F | Ht 67.0 in | Wt 156.0 lb

## 2016-10-01 DIAGNOSIS — G6289 Other specified polyneuropathies: Secondary | ICD-10-CM | POA: Diagnosis not present

## 2016-10-01 DIAGNOSIS — E119 Type 2 diabetes mellitus without complications: Secondary | ICD-10-CM

## 2016-10-01 MED ORDER — INSULIN ASPART PROT & ASPART (70-30 MIX) 100 UNIT/ML ~~LOC~~ SUSP: SUBCUTANEOUS | 11 refills | Status: DC

## 2016-10-01 NOTE — Patient Instructions (Signed)
Take 18 units of insulin in the morning Take 15 units in the morning

## 2016-10-01 NOTE — Progress Notes (Signed)
  Subjective:   Patient ID: Kellie Pearson, female    DOB: 02/28/1966, 51 y.o.   MRN: [id number] CC: Diabetes (4 week rck)  HPI: Kellie Riisatasha C Lococo is a 51 y.o. female presenting for Diabetes (4 week rck)  DM2: When she checks before she eats, says BGLs 120s-140s in the morning, low ot 74 when she took extra 4 units in early AM with BGL of 228 A couple lows before dinner Sometimes skips lunch Sometimes only eats a a green salad Eats irregular meals, sometimes dinner at 5pm, sometimes at 8pm Sometimes no carbs, sometimes more carbs with dinner  Sometimes has burning and tingling in feet and all over her body She is not sure if the gabapentin has been helping or not  Relevant past medical, surgical, family and social history reviewed. Allergies and medications reviewed and updated. History  Smoking Status  . Former Smoker  Smokeless Tobacco  . Never Used   ROS: Per HPI   Objective:    BP 124/78   Pulse 97   Temp 97.6 F (36.4 C) (Oral)   Ht 5\' 7"  (1.702 m)   Wt 156 lb (70.8 kg)   LMP 10/01/2016   BMI 24.43 kg/m   Wt Readings from Last 3 Encounters:  10/01/16 156 lb (70.8 kg)  08/29/16 159 lb 12.8 oz (72.5 kg)  08/21/16 148 lb 12.8 oz (67.5 kg)    Gen: NAD, alert, cooperative with exam, NCAT EYES: EOMI, no conjunctival injection, or no icterus ENT:  TMs pearly gray b/l, OP without erythema CV: NRRR, normal S1/S2, no murmur, distal pulses 2+ b/l Resp: CTABL, no wheezes, normal WOB Ext: No edema, warm Neuro: Alert and oriented, strength equal b/l UE and LE, coordination grossly normal MSK: normal muscle bulk Psych: well groomed, slightly disorganized thoughts  Assessment & Plan:  [name] was seen today for diabetes.  Diagnoses and all orders for this visit:  Diabetes mellitus without complication (HCC) Try to eat more consistent meals  Decrease insulin from 20u BID to below Any more lows let me know -     insulin aspart protamine- aspart (NOVOLOG MIX 70/30) (70-30)  100 UNIT/ML injection; Take 18 units in the morning, 15 units at night  Other polyneuropathy Cont gabapentin, let me know if worsening symptoms  Follow up plan: Return in about 4 weeks (around 10/29/2016). Rex Krasarol Vincent, MD [facility]

## 2016-10-08 ENCOUNTER — Other Ambulatory Visit: Payer: Self-pay | Admitting: *Deleted

## 2016-10-08 DIAGNOSIS — E119 Type 2 diabetes mellitus without complications: Secondary | ICD-10-CM

## 2016-10-08 MED ORDER — INSULIN ASPART PROT & ASPART (70-30 MIX) 100 UNIT/ML ~~LOC~~ SUSP: SUBCUTANEOUS | 11 refills | Status: AC

## 2016-10-10 ENCOUNTER — Telehealth: Payer: Self-pay | Admitting: Pediatrics

## 2016-10-10 NOTE — Telephone Encounter (Signed)
What symptoms do you have? Hurting mid area of back and can't sleep  How long have you been sick? Two days  Have you been seen for this problem? NO  If your provider decides to give you a prescription, which pharmacy would you like for it to be sent to? [facility] in [place]   Patient informed that this information will be sent to the clinical staff for review and that they should receive a follow up call.

## 2016-10-10 NOTE — Telephone Encounter (Signed)
That's fine, if not improving needs to be seen

## 2016-10-10 NOTE — Telephone Encounter (Signed)
Please review and route to Pool B 

## 2016-10-10 NOTE — Telephone Encounter (Signed)
Pt has not had any specific injury Right sided mid back pain She takes her gabapentin & BP meds as prescribed She has not taken any OTC tylenol or ibuprofen yet for this Will see how this and helps and will call back if no relief

## 2016-10-11 ENCOUNTER — Encounter: Payer: Self-pay | Admitting: Pediatrics

## 2016-10-11 DIAGNOSIS — E13319 Other specified diabetes mellitus with unspecified diabetic retinopathy without macular edema: Secondary | ICD-10-CM | POA: Insufficient documentation

## 2016-10-24 ENCOUNTER — Encounter (HOSPITAL_COMMUNITY): Payer: Self-pay | Admitting: Emergency Medicine

## 2016-10-24 ENCOUNTER — Emergency Department (HOSPITAL_COMMUNITY)
Admission: EM | Admit: 2016-10-24 | Discharge: 2016-10-24 | Disposition: A | Discharge status: Home / Self Care | Payer: Medicare Other | Attending: Emergency Medicine | Admitting: Emergency Medicine

## 2016-10-24 DIAGNOSIS — E162 Hypoglycemia, unspecified: Secondary | ICD-10-CM | POA: Diagnosis not present

## 2016-10-24 DIAGNOSIS — Z79899 Other long term (current) drug therapy: Secondary | ICD-10-CM | POA: Insufficient documentation

## 2016-10-24 DIAGNOSIS — E10319 Type 1 diabetes mellitus with unspecified diabetic retinopathy without macular edema: Secondary | ICD-10-CM | POA: Diagnosis not present

## 2016-10-24 DIAGNOSIS — Z794 Long term (current) use of insulin: Secondary | ICD-10-CM | POA: Diagnosis not present

## 2016-10-24 DIAGNOSIS — E11649 Type 2 diabetes mellitus with hypoglycemia without coma: Secondary | ICD-10-CM | POA: Diagnosis not present

## 2016-10-24 LAB — CBC WITH DIFFERENTIAL/PLATELET
BASOS ABS: 0 10*3/uL (ref 0.0–0.1)
BASOS PCT: 0 %
Eosinophils Absolute: 0 10*3/uL (ref 0.0–0.7)
Eosinophils Relative: 1 %
HEMATOCRIT: 35.5 % — AB (ref 36.0–46.0)
HEMOGLOBIN: 11.6 g/dL — AB (ref 12.0–15.0)
LYMPHS PCT: 19 %
Lymphs Abs: 1.5 10*3/uL (ref 0.7–4.0)
MCH: 25.8 pg — ABNORMAL LOW (ref 26.0–34.0)
MCHC: 32.7 g/dL (ref 30.0–36.0)
MCV: 79.1 fL (ref 78.0–100.0)
Monocytes Absolute: 0.4 10*3/uL (ref 0.1–1.0)
Monocytes Relative: 5 %
NEUTROS ABS: 5.8 10*3/uL (ref 1.7–7.7)
NEUTROS PCT: 75 %
Platelets: 236 10*3/uL (ref 150–400)
RBC: 4.49 MIL/uL (ref 3.87–5.11)
RDW: 16 % — AB (ref 11.5–15.5)
WBC: 7.7 10*3/uL (ref 4.0–10.5)

## 2016-10-24 LAB — CBG MONITORING, ED
GLUCOSE-CAPILLARY: 80 mg/dL (ref 65–99)
GLUCOSE-CAPILLARY: 148 mg/dL — AB (ref 65–99)
Glucose-Capillary: 264 mg/dL — ABNORMAL HIGH (ref 65–99)
Glucose-Capillary: 366 mg/dL — ABNORMAL HIGH (ref 65–99)

## 2016-10-24 LAB — BASIC METABOLIC PANEL
ANION GAP: 11 (ref 5–15)
BUN: 24 mg/dL — AB (ref 6–20)
CALCIUM: 9.3 mg/dL (ref 8.9–10.3)
CO2: 25 mmol/L (ref 22–32)
Chloride: 105 mmol/L (ref 101–111)
Creatinine, Ser: 0.88 mg/dL (ref 0.44–1.00)
GFR calc Af Amer: >60 mL/min (ref >60)
GLUCOSE: 189 mg/dL — AB (ref 65–99)
Potassium: 3.6 mmol/L (ref 3.5–5.1)
SODIUM: 141 mmol/L (ref 135–145)

## 2016-10-24 NOTE — Discharge Instructions (Signed)
Make sure you eat 3 meals a day.  Follow up with your md next week

## 2016-10-24 NOTE — ED Triage Notes (Addendum)
Pt brought in EMS for events related to hypoglycemia. Per EMS, fire department obtained a cbg of 29. At time of EMS arrival cbg 2689. Pt alert and oriented at time of arrival to ED. Moderate agitation noted. cbg in ED 80. Pt denies pain,n/v/d. Pt reports takes 18 units of "70/30 insulin" in the morning and at night.

## 2016-10-24 NOTE — ED Provider Notes (Signed)
[facility] DEPT Provider Note   CSN: [id number] Arrival date & time: 10/24/16  1733     History   Chief Complaint Chief Complaint  Patient presents with  . Hypoglycemia    HPI Kellie Pearson is a 51 y.o. female.  Patient was brought in because she had a low glucose. Patient took her insulin this morning had breakfast but did not eat her lunch. She takes insulin twice a day.  Patient was given glucose by paramedic and her glucose became normal.   The history is provided by the patient. No language interpreter was used.  Hypoglycemia  Initial blood sugar:  Low Blood sugar after intervention:  Paramedics gave glucose Severity:  Severe Onset quality:  Sudden Timing:  Intermittent Progression:  Resolved Chronicity:  New Diabetic status:  Controlled with insulin Associated symptoms: no seizures     Past Medical History:  Diagnosis Date  . Abscess    admitted 5/14  . Diabetes mellitus without complication ([place])   . Hidradenitis suppurativa     Patient Active Problem List   Diagnosis Date Noted  . Retinopathy due to secondary diabetes ([place]) 10/11/2016  . Delusional disorder ([place]) 08/21/2016  . Acute lower UTI 08/20/2016  . Hyponatremia 06/15/2016  . Diabetes mellitus with complication ([place]) 11/65/7903  . Peripheral neuropathy 06/15/2016  . Homelessness 06/15/2016  . Leukocytosis 06/15/2016  . Perirectal abscess   . Hidradenitis suppurativa   . Tachycardia 09/01/2012  . Skin abscess 09/01/2012    Past Surgical History:  Procedure Laterality Date  . BACK SURGERY      OB History    Gravida Para Term Preterm AB Living   0 0 0 0 0 0   SAB TAB Ectopic Multiple Live Births   0 0 0 0 0       Home Medications    Prior to Admission medications   Medication Sig Start Date End Date Taking? Authorizing Provider  Blood Glucose Monitoring Suppl (ONETOUCH VERIO) w/Device KIT 1 kit by Does not apply route 4 (four) times daily. 08/29/16   Eustaquio Maize, MD    gabapentin (NEURONTIN) 100 MG capsule Take 1 capsule (100 mg total) by mouth 2 (two) times daily. 08/29/16   Eustaquio Maize, MD  glucose blood (ONETOUCH VERIO) test strip Use 4 times daily. 09/03/16   Eustaquio Maize, MD  insulin aspart protamine- aspart (NOVOLOG MIX 70/30) (70-30) 100 UNIT/ML injection Take 18 units in the morning, 15 units at night 10/08/16   Eustaquio Maize, MD  lisinopril (PRINIVIL,ZESTRIL) 5 MG tablet Take 1 tablet (5 mg total) by mouth daily. 08/29/16   Eustaquio Maize, MD  Multiple Vitamin (MULTIVITAMIN WITH MINERALS) TABS tablet Take 1 tablet by mouth daily. 06/19/16   Isaac Bliss, Rayford Halsted, MD  ONE TOUCH LANCETS MISC 1 each by Does not apply route 4 (four) times daily. 08/29/16   Eustaquio Maize, MD    Family History Family History  Problem Relation Age of Onset  . Diabetes Mother   . Diabetes Father     Social History Social History  Substance Use Topics  . Smoking status: Former Research scientist (life sciences)  . Smokeless tobacco: Never Used  . Alcohol use No     Allergies   Patient has no known allergies.   Review of Systems Review of Systems  Constitutional: Negative for appetite change and fatigue.  HENT: Negative for congestion, ear discharge and sinus pressure.   Eyes: Negative for discharge.  Respiratory: Negative for cough.  Cardiovascular: Negative for chest pain.  Gastrointestinal: Negative for abdominal pain and diarrhea.  Genitourinary: Negative for frequency and hematuria.  Musculoskeletal: Negative for back pain.  Skin: Negative for rash.  Neurological: Negative for seizures and headaches.  Psychiatric/Behavioral: Negative for hallucinations.     Physical Exam Updated Vital Signs BP (!) 146/86 (BP Location: Left Arm)   Pulse (!) 101   Temp 97.6 F (36.4 C) (Oral)   Resp 18   Ht '5\' 9"'$  (1.753 m)   Wt 70.3 kg (155 lb)   LMP 10/01/2016   SpO2 100%   BMI 22.89 kg/m   Physical Exam  Constitutional: She is oriented to person, place, and time.  She appears well-developed.  HENT:  Head: Normocephalic.  Eyes: Conjunctivae and EOM are normal. No scleral icterus.  Neck: Neck supple. No thyromegaly present.  Cardiovascular: Normal rate and regular rhythm.  Exam reveals no gallop and no friction rub.   No murmur heard. Pulmonary/Chest: No stridor. She has no wheezes. She has no rales. She exhibits no tenderness.  Abdominal: She exhibits no distension. There is no tenderness. There is no rebound.  Genitourinary: Vaginal discharge: patient hashad a large meal here in emergency department her sugars have been elevated and she feels fine. She will be discharged home and follow-up with her PCP.  Musculoskeletal: Normal range of motion. She exhibits no edema.  Lymphadenopathy:    She has no cervical adenopathy.  Neurological: She is oriented to person, place, and time. She exhibits normal muscle tone. Coordination normal.  Skin: No rash noted. No erythema.  Psychiatric: She has a normal mood and affect. Her behavior is normal.     ED Treatments / Results  Labs (all labs ordered are listed, but only abnormal results are displayed) Labs Reviewed  CBC WITH DIFFERENTIAL/PLATELET - Abnormal; Notable for the following:       Result Value   Hemoglobin 11.6 (*)    HCT 35.5 (*)    MCH 25.8 (*)    RDW 16.0 (*)    All other components within normal limits  BASIC METABOLIC PANEL - Abnormal; Notable for the following:    Glucose, Bld 189 (*)    BUN 24 (*)    All other components within normal limits  CBG MONITORING, ED - Abnormal; Notable for the following:    Glucose-Capillary 148 (*)    All other components within normal limits  CBG MONITORING, ED - Abnormal; Notable for the following:    Glucose-Capillary 264 (*)    All other components within normal limits  CBG MONITORING, ED - Abnormal; Notable for the following:    Glucose-Capillary 366 (*)    All other components within normal limits  CBG MONITORING, ED    EKG  EKG  Interpretation None       Radiology No results found.  Procedures Procedures (including critical care time)  Medications Ordered in ED Medications - No data to display   Initial Impression / Assessment and Plan / ED Course  I have reviewed the triage vital signs and the nursing notes.  Pertinent labs & imaging results that were available during my care of the patient were reviewed by me and considered in my medical decision making (see chart for details).     Patient with hypoglycemia secondary to not eating her lunch.  Final Clinical Impressions(s) / ED Diagnoses   Final diagnoses:  Hypoglycemia    New Prescriptions New Prescriptions   No medications on file     Bradshaw Minihan,  Broadus John, MD 10/24/16 2109

## 2016-10-31 ENCOUNTER — Encounter: Payer: Self-pay | Admitting: Pediatrics

## 2016-10-31 ENCOUNTER — Ambulatory Visit (INDEPENDENT_AMBULATORY_CARE_PROVIDER_SITE_OTHER): Payer: Medicare Other | Admitting: Pediatrics

## 2016-10-31 VITALS — BP 99/62 | HR 105 | Temp 97.6°F | Ht 69.0 in | Wt 150.0 lb

## 2016-10-31 DIAGNOSIS — E119 Type 2 diabetes mellitus without complications: Secondary | ICD-10-CM | POA: Diagnosis not present

## 2016-10-31 DIAGNOSIS — G6289 Other specified polyneuropathies: Secondary | ICD-10-CM | POA: Diagnosis not present

## 2016-10-31 DIAGNOSIS — I10 Essential (primary) hypertension: Secondary | ICD-10-CM | POA: Diagnosis not present

## 2016-10-31 NOTE — Patient Instructions (Signed)
Stop lisinopril.

## 2016-10-31 NOTE — Progress Notes (Signed)
  Subjective:   Patient ID: Kellie Pearson, female    DOB: 06/29/1965, 51 y.o.   MRN: [id number] CC: Follow-up (4 week) med problems HPI: Kellie Pearson is a 51 y.o. female presenting for Follow-up (4 week)  DM2: seen in ED 1 week ago with low BGL after skipping lunch Says she was sick throwing up for several days after being in the ED, took about three days before eating more regularly BGLs have been up to 200 for the past week, no lows Fell in the bathroom Feels lightheaded last few days dirnking plenty of water BGL 189 this morning  Taking 18-20u twice a day of 70/30  HTN: doesn't check at home No CP Has felt "staggery" the last few days  Relevant past medical, surgical, family and social history reviewed. Allergies and medications reviewed and updated. History  Smoking Status  . Former Smoker  Smokeless Tobacco  . Never Used   ROS: Per HPI   Objective:    BP 99/62   Pulse (!) 105   Temp 97.6 F (36.4 C) (Oral)   Ht 5\' 9"  (1.753 m)   Wt 150 lb (68 kg)   LMP 10/01/2016   BMI 22.15 kg/m   Wt Readings from Last 3 Encounters:  10/31/16 150 lb (68 kg)  10/24/16 155 lb (70.3 kg)  10/01/16 156 lb (70.8 kg)    Gen: NAD, alert, cooperative with exam, NCAT EYES: EOMI, no conjunctival injection, or no icterus ENT:OP without erythema LYMPH: no cervical LAD CV: NRRR, normal S1/S2, no murmur, distal pulses 2+ b/l Resp: CTABL, no wheezes, normal WOB Abd: +BS, soft, NTND. no guarding or organomegaly Ext: No edema, warm Neuro: Alert and oriented, strength equal b/l UE and LE, coordination grossly normal MSK: normal muscle bulk  Assessment & Plan:  Kellie Pearson was seen today for follow-up med problems.  Diagnoses and all orders for this visit:  Diabetes mellitus without complication (HCC) Has decreased insulin past few days with decreased appetite BGLs 100s up to low 200 No lows Cont small regular meals, frequent fluids, BID insulin Back to normal dose 18u AM, 15u PM  when appetite returns No further nausea/vomiting past few days  Other polyneuropathy Taking gabapentin regularly, helping some  Essential hypertension BP low initially in clinic Felt slightly lightheaded Had just taken lisinopril 5mg  at home before coming Stop lisinopril Repeat BMP with N/V past few week, symptoms have been improving last few days -     Basic Metabolic Panel   Follow up plan: Return in about 4 weeks (around 11/28/2016) for follow up. Rex Krasarol Dena Esperanza, MD [facility]

## 2016-11-01 LAB — BASIC METABOLIC PANEL
BUN/Creatinine Ratio: 26 — ABNORMAL HIGH (ref 9–23)
BUN: 31 mg/dL — AB (ref 6–24)
CALCIUM: 9 mg/dL (ref 8.7–10.2)
CHLORIDE: 96 mmol/L (ref 96–106)
CO2: 21 mmol/L (ref 20–29)
Creatinine, Ser: 1.2 mg/dL — ABNORMAL HIGH (ref 0.57–1.00)
GFR calc non Af Amer: 53 mL/min/{1.73_m2} — ABNORMAL LOW (ref >59)
GFR, EST AFRICAN AMERICAN: 61 mL/min/{1.73_m2} (ref >59)
Glucose: 381 mg/dL — ABNORMAL HIGH (ref 65–99)
POTASSIUM: 4.4 mmol/L (ref 3.5–5.2)
Sodium: 137 mmol/L (ref 134–144)

## 2016-11-28 ENCOUNTER — Ambulatory Visit (INDEPENDENT_AMBULATORY_CARE_PROVIDER_SITE_OTHER): Payer: Medicare Other | Admitting: Pediatrics

## 2016-11-28 ENCOUNTER — Encounter: Payer: Self-pay | Admitting: Pediatrics

## 2016-11-28 VITALS — BP 70/55 | HR 114 | Temp 97.2°F | Ht 69.0 in | Wt 141.0 lb

## 2016-11-28 DIAGNOSIS — G629 Polyneuropathy, unspecified: Secondary | ICD-10-CM

## 2016-11-28 DIAGNOSIS — I951 Orthostatic hypotension: Secondary | ICD-10-CM

## 2016-11-28 DIAGNOSIS — Z794 Long term (current) use of insulin: Secondary | ICD-10-CM

## 2016-11-28 DIAGNOSIS — E1140 Type 2 diabetes mellitus with diabetic neuropathy, unspecified: Secondary | ICD-10-CM

## 2016-11-28 DIAGNOSIS — I959 Hypotension, unspecified: Secondary | ICD-10-CM

## 2016-11-28 DIAGNOSIS — E118 Type 2 diabetes mellitus with unspecified complications: Secondary | ICD-10-CM

## 2016-11-28 DIAGNOSIS — E119 Type 2 diabetes mellitus without complications: Secondary | ICD-10-CM

## 2016-11-28 DIAGNOSIS — N390 Urinary tract infection, site not specified: Secondary | ICD-10-CM

## 2016-11-28 DIAGNOSIS — R8281 Pyuria: Secondary | ICD-10-CM

## 2016-11-28 LAB — URINALYSIS, COMPLETE
BILIRUBIN UA: NEGATIVE
NITRITE UA: POSITIVE — AB
PH UA: 5 (ref 5.0–7.5)
Specific Gravity, UA: 1.01 (ref 1.005–1.030)
UUROB: 0.2 mg/dL (ref 0.2–1.0)

## 2016-11-28 LAB — MICROSCOPIC EXAMINATION: RENAL EPITHEL UA: NONE SEEN /HPF

## 2016-11-28 LAB — BAYER DCA HB A1C WAIVED: HB A1C: 10.2 % — AB (ref <7.0)

## 2016-11-28 MED ORDER — GABAPENTIN 100 MG PO CAPS: 100.0000 mg | ORAL_CAPSULE | Freq: Two times a day (BID) | ORAL | 2 refills | Status: DC

## 2016-11-28 MED ORDER — CIPROFLOXACIN HCL 500 MG PO TABS: 500.0000 mg | ORAL_TABLET | Freq: Two times a day (BID) | ORAL | 0 refills | Status: DC

## 2016-11-28 MED ORDER — SODIUM CHLORIDE 1 G PO TABS: 1.0000 g | ORAL_TABLET | Freq: Three times a day (TID) | ORAL | 1 refills | Status: DC

## 2016-11-28 NOTE — Progress Notes (Signed)
Subjective:   Patient ID: Kellie Pearson, female    DOB: 01-22-1966, 51 y.o.   MRN: [id number] CC: Follow-up (4 week) multiple med problems HPI: Kellie Pearson is a 51 y.o. female presenting for Follow-up (4 week)  DM2: 140-200 at home, sometimes higher Says she ate three meals yesterday Sometimes skips meals, yesterday ate three emals Says didn't drink as much fluids as usual yesterday, but usually is pretty good with fluid intake, sometimes gets a gallon  One episode of loose stool  No fevers Has been feeling woozy, lightheaded Stopped all blood pressure medicines No abd pain Taking her insulin regularly, sometimes skips it when she thinks it might be low No dysuria  Relevant past medical, surgical, family and social history reviewed. Allergies and medications reviewed and updated. History  Smoking Status  . Former Smoker  Smokeless Tobacco  . Never Used   ROS: Per HPI   Objective:    BP (!) 70/55   Pulse (!) 114   Temp (!) 97.2 F (36.2 C) (Oral)   Ht _0  (1.753 m)   Wt 141 lb (64 kg)   BMI 20.82 kg/m   Wt Readings from Last 3 Encounters:  11/28/16 141 lb (64 kg)  10/31/16 150 lb (68 kg)  10/24/16 155 lb (70.3 kg)    BP 99/73 rechecked  Gen: NAD, alert, cooperative with exam, NCAT EYES: EOMI, no conjunctival injection, or no icterus ENT:  TMs pearly gray b/l, OP without erythema LYMPH: no cervical LAD CV: NRRR, normal S1/S2, no murmur, distal pulses 2+ b/l Resp: CTABL, no wheezes, normal WOB Abd: +BS, soft, NTND. no guarding or organomegaly Ext: No edema, warm Neuro: Alert and oriented, strength equal b/l UE and LE, coordination grossly normal MSK: normal muscle bulk  Assessment & Plan:  Kellie Pearson was seen today for follow-up med problems.  Diagnoses and all orders for this visit:  Diabetes mellitus with complication (HCC) Repeat A1c 10.2, improved, not taking insulin every day, but reports BGLs improved per her report -     Bayer DCA Hb A1c  Waived -     Microalbumin / creatinine urine ratio -     Blood Glucose Monitoring Suppl (ONETOUCH VERIO) w/Device KIT; 1 kit by Does not apply route 5 (five) times daily. -     CMP14+EGFR -     CBC with Differential/Platelet -     TSH  Hypotension, unspecified hypotension type Other than lightheadedness, pt is feeling well No fevers, no abd pain Has been going on for months Now off of ACE-I -     Urinalysis, Complete  Neuropathy Stable, cont below -     gabapentin (NEURONTIN) 100 MG capsule; Take 1 capsule (100 mg total) by mouth 2 (two) times daily.  Orthostatic hypotension Increase water intake, increase salt intake If not improving rtc If ongoing symptoms once other problems resolved, will refer to cardiology for POTs eval  Pyuria No symptoms, will send for culture, start abx given above symptoms -     Urine Culture  Other orders -     Microscopic Examination -     ciprofloxacin (CIPRO) 500 MG tablet; Take 1 tablet (500 mg total) by mouth 2 (two) times daily.   Follow up plan: Return in about 2 weeks (around 12/12/2016). Kellie Found, MD [facility]   ADDENDUM: BMP with elevated BGL to 510, CO2 19 concerned given BPs yesterday and labs now that pt may be in start of DKA, has h/o non-compliance  with meds and DKA Called pt Says she is feeling fine Can't check her blood sugar because she doesn't have strips Took insulin last night, hasnt taken it yet this morning Discussed I sent strips in to [facility], she should Pearson them up She wants to know how much they cost Called [facility], strips will be $3 for her today Called pt back She will go to get strips Let me know if BGL still elevated when she checks Any worsening symptoms, needs to be seen Pt with ongoing difficulty getting medications and strips due to cost, also with poor understanding of diabetes, medicine use. Will see if can get Kellie Pearson assistance for further diabetes education and medication  assistance.

## 2016-11-28 NOTE — Patient Instructions (Addendum)
Drink large glass of water with every meal Take salt tab three times a day with each meal  Avoid all alcohol intake

## 2016-11-29 ENCOUNTER — Telehealth: Payer: Self-pay | Admitting: Pediatrics

## 2016-11-29 ENCOUNTER — Telehealth: Payer: Self-pay | Admitting: *Deleted

## 2016-11-29 ENCOUNTER — Other Ambulatory Visit: Payer: Medicare Other

## 2016-11-29 DIAGNOSIS — E118 Type 2 diabetes mellitus with unspecified complications: Secondary | ICD-10-CM

## 2016-11-29 DIAGNOSIS — E119 Type 2 diabetes mellitus without complications: Secondary | ICD-10-CM

## 2016-11-29 LAB — CMP14+EGFR
ALT: 11 IU/L (ref 0–32)
AST: 12 IU/L (ref 0–40)
Albumin/Globulin Ratio: 1.1 — ABNORMAL LOW (ref 1.2–2.2)
Albumin: 4.2 g/dL (ref 3.5–5.5)
Alkaline Phosphatase: 132 IU/L — ABNORMAL HIGH (ref 39–117)
BUN/Creatinine Ratio: 22 (ref 9–23)
BUN: 31 mg/dL — AB (ref 6–24)
Bilirubin Total: 0.2 mg/dL (ref 0.0–1.2)
CALCIUM: 10 mg/dL (ref 8.7–10.2)
CO2: 19 mmol/L — AB (ref 20–29)
CREATININE: 1.39 mg/dL — AB (ref 0.57–1.00)
Chloride: 98 mmol/L (ref 96–106)
GFR, EST AFRICAN AMERICAN: 51 mL/min/{1.73_m2} — AB (ref >59)
GFR, EST NON AFRICAN AMERICAN: 45 mL/min/{1.73_m2} — AB (ref >59)
Globulin, Total: 3.8 g/dL (ref 1.5–4.5)
Glucose: 510 mg/dL (ref 65–99)
Potassium: 5 mmol/L (ref 3.5–5.2)
Sodium: 138 mmol/L (ref 134–144)
TOTAL PROTEIN: 8 g/dL (ref 6.0–8.5)

## 2016-11-29 LAB — CBC WITH DIFFERENTIAL/PLATELET
BASOS: 1 %
Basophils Absolute: 0 10*3/uL (ref 0.0–0.2)
EOS (ABSOLUTE): 0.1 10*3/uL (ref 0.0–0.4)
EOS: 2 %
HEMATOCRIT: 34 % (ref 34.0–46.6)
HEMOGLOBIN: 10.7 g/dL — AB (ref 11.1–15.9)
IMMATURE GRANS (ABS): 0 10*3/uL (ref 0.0–0.1)
IMMATURE GRANULOCYTES: 0 %
LYMPHS: 40 %
Lymphocytes Absolute: 2.2 10*3/uL (ref 0.7–3.1)
MCH: 26.2 pg — ABNORMAL LOW (ref 26.6–33.0)
MCHC: 31.5 g/dL (ref 31.5–35.7)
MCV: 83 fL (ref 79–97)
MONOCYTES: 6 %
Monocytes Absolute: 0.3 10*3/uL (ref 0.1–0.9)
NEUTROS PCT: 51 %
Neutrophils Absolute: 2.9 10*3/uL (ref 1.4–7.0)
PLATELETS: 315 10*3/uL (ref 150–379)
RBC: 4.08 x10E6/uL (ref 3.77–5.28)
RDW: 15.5 % — ABNORMAL HIGH (ref 12.3–15.4)
WBC: 5.6 10*3/uL (ref 3.4–10.8)

## 2016-11-29 LAB — GLUCOSE HEMOCUE WAIVED: Glu Hemocue Waived: 314 mg/dL — ABNORMAL HIGH (ref 65–99)

## 2016-11-29 LAB — MICROALBUMIN / CREATININE URINE RATIO
Creatinine, Urine: 135.6 mg/dL
MICROALBUM., U, RANDOM: 156.7 ug/mL
Microalb/Creat Ratio: 115.6 mg/g creat — ABNORMAL HIGH (ref 0.0–30.0)

## 2016-11-29 LAB — TSH: TSH: 1.17 u[IU]/mL (ref 0.450–4.500)

## 2016-11-29 MED ORDER — ONETOUCH VERIO W/DEVICE KIT: 1.0000 | PACK | Freq: Every day | 0 refills | Status: AC

## 2016-11-29 MED ORDER — CIPROFLOXACIN HCL 500 MG PO TABS: 500.0000 mg | ORAL_TABLET | Freq: Two times a day (BID) | ORAL | 0 refills | Status: DC

## 2016-11-29 MED ORDER — GLUCOSE BLOOD VI STRP: ORAL_STRIP | 5 refills | Status: AC

## 2016-11-29 NOTE — Telephone Encounter (Signed)
Called patient with elevated glucose reading and to see what glucose reading is at this time.  Patient states that she does not have time she is getting in the shower.  Informed patient how important it was to check BS at this time.  Patient continuously states that she does not have time to check it.  Dr. Oswaldo DoneVincent made aware

## 2016-11-29 NOTE — Telephone Encounter (Signed)
Pt came by clinic, initial BGL high, recheck was 314. BP 100s/70s. Says she is feeling fine. Still sometimes woozy. Will go from here to pick up antibiotic. Gave chart to keep track of BGLs, check morning and night before giving insulin. Pt to take insulin twice every day.

## 2016-11-30 LAB — URINE CULTURE

## 2016-12-12 ENCOUNTER — Encounter: Payer: Self-pay | Admitting: Pediatrics

## 2016-12-12 ENCOUNTER — Ambulatory Visit (INDEPENDENT_AMBULATORY_CARE_PROVIDER_SITE_OTHER): Payer: Medicare Other | Admitting: Pediatrics

## 2016-12-12 VITALS — BP 85/61 | HR 102 | Temp 97.5°F | Ht 69.0 in | Wt 148.4 lb

## 2016-12-12 DIAGNOSIS — E118 Type 2 diabetes mellitus with unspecified complications: Secondary | ICD-10-CM

## 2016-12-12 DIAGNOSIS — G629 Polyneuropathy, unspecified: Secondary | ICD-10-CM

## 2016-12-12 DIAGNOSIS — I959 Hypotension, unspecified: Secondary | ICD-10-CM | POA: Diagnosis not present

## 2016-12-12 NOTE — Progress Notes (Signed)
  Subjective:   Patient ID: Kellie Pearson, female    DOB: 03-Nov-1965, 51 y.o.   MRN: [id number] CC: Follow-up multiple med problems HPI: Kellie Pearson is a 51 y.o. female presenting for Follow-up  BGLs in the morning 130s-170s Has been checking twice a day most days since last office visit Lightheadedness has improved since last visit Feels more like normal self Taking insulin most days Likes eating carb heavy foods Has decreased eating overall past two weeks to keep sugars in good range No fevers No UTI symptoms Finished abx from last visit  Last visit found to have UTI, with elevated BGLs  Relevant past medical, surgical, family and social history reviewed. Allergies and medications reviewed and updated. History  Smoking Status  . Former Smoker  Smokeless Tobacco  . Never Used   ROS: Per HPI   Objective:    BP (!) 85/61   Pulse (!) 102   Temp (!) 97.5 F (36.4 C) (Oral)   Ht 5\' 9"  (1.753 m)   Wt 148 lb 6.4 oz (67.3 kg)   BMI 21.91 kg/m   Wt Readings from Last 3 Encounters:  12/12/16 148 lb 6.4 oz (67.3 kg)  11/28/16 141 lb (64 kg)  10/31/16 150 lb (68 kg)    Gen: NAD, alert, cooperative with exam, NCAT EYES: EOMI, no conjunctival injection, or no icterus CV: NRRR, normal S1/S2, no murmur, distal pulses 2+ b/l Resp: CTABL, no wheezes, normal WOB Abd: +BS, soft, NTND. no guarding or organomegaly Ext: No edema, warm Neuro: Alert and oriented MSK: normal muscle bulk  Assessment & Plan:  [name] was seen today for follow-up med problems.  Diagnoses and all orders for this visit:  Hypotension, unspecified hypotension type Stable, no symptoms Continue drinking non-sugary beverages regularly Off of all BP meds now  Diabetes mellitus with complication (HCC) H/o nephropathy, intolerant of ACE-I due to hypotension BGLs improved Continue to check twice a day, bring numbers back to clinic Cont current insulin dosing, 18u am, 15u pm     Neuropathy Stable,  thinks the gabapentin makes her feel off Will stop for now  Follow up plan: Return in about 3 months (around 02/28/2017). Rex Kras, MD [facility]

## 2017-01-15 ENCOUNTER — Encounter: Payer: Self-pay | Admitting: Pediatrics

## 2017-01-15 ENCOUNTER — Ambulatory Visit (INDEPENDENT_AMBULATORY_CARE_PROVIDER_SITE_OTHER): Payer: Medicare Other | Admitting: Pediatrics

## 2017-01-15 VITALS — BP 139/88 | HR 101 | Temp 96.9°F | Ht 69.0 in | Wt 152.0 lb

## 2017-01-15 DIAGNOSIS — Z124 Encounter for screening for malignant neoplasm of cervix: Secondary | ICD-10-CM

## 2017-01-15 DIAGNOSIS — Z0000 Encounter for general adult medical examination without abnormal findings: Secondary | ICD-10-CM

## 2017-01-15 DIAGNOSIS — N63 Unspecified lump in unspecified breast: Secondary | ICD-10-CM

## 2017-01-15 DIAGNOSIS — Z01419 Encounter for gynecological examination (general) (routine) without abnormal findings: Secondary | ICD-10-CM | POA: Diagnosis not present

## 2017-01-15 NOTE — Progress Notes (Signed)
  Subjective:   Patient ID: Kellie Pearson, female    DOB: Nov 06, 1965, 51 y.o.   MRN: [id number] CC: Gynecologic Exam  HPI: Kellie Pearson is a 51 y.o. female presenting for Gynecologic Exam  Feeling better Not feeling dizzy Says BGLs in 100s in the morning  Otherwise feeling well No recent fevers Appetite has been normal  No fam h/o colon or breast ca  Relevant past medical, surgical, family and social history reviewed. Allergies and medications reviewed and updated. History  Smoking Status  . Former Smoker  Smokeless Tobacco  . Never Used   ROS: All systems negative other than what is in HPI  Objective:    BP 139/88   Pulse (!) 101   Temp (!) 96.9 F (36.1 C) (Oral)   Ht  (1.753 m)   Wt 152 lb (68.9 kg)   BMI 22.45 kg/m   Wt Readings from Last 3 Encounters:  01/15/17 152 lb (68.9 kg)  12/12/16 148 lb 6.4 oz (67.3 kg)  11/28/16 141 lb (64 kg)    Gen: NAD, alert, cooperative with exam, NCAT EYES: EOMI, no conjunctival injection, or no icterus ENT:  TMs pearly gray b/l, OP without erythema LYMPH: no cervical LAD CV: NRRR, normal S1/S2, no murmur, distal pulses 2+ b/l Resp: CTABL, no wheezes, normal WOB Abd: +BS, soft, NTND. no guarding or organomegaly Ext: No edema, warm Neuro: Alert and oriented, strength equal b/l UE and LE, coordination grossly normal GU: nl external female genitalia Breast: L breast with palpable mass apprx 2 cm central breast to L of areola  Assessment & Plan:  Kellie Pearson was seen today for gynecologic exam.  Diagnoses and all orders for this visit:  Encounter for preventive health examination  Cervical cancer screening Pt doesn't remember any abnormals -     Pap IG, CT/NG NAA, and HPV (high risk) [facility]  Breast nodule  Breast mass Palpable nodule/mass Will get diagnostic u/s/mammo -     MM Digital Screening; Future -     MM Digital Diagnostic Unilat L; Future   Follow up plan: Return in about 2 months (around  03/17/2017). Rex Kras, MD [facility]

## 2017-01-16 ENCOUNTER — Other Ambulatory Visit: Payer: Self-pay | Admitting: Pediatrics

## 2017-01-16 DIAGNOSIS — N6320 Unspecified lump in the left breast, unspecified quadrant: Secondary | ICD-10-CM

## 2017-01-16 DIAGNOSIS — N63 Unspecified lump in unspecified breast: Secondary | ICD-10-CM

## 2017-01-18 ENCOUNTER — Other Ambulatory Visit: Payer: Self-pay | Admitting: Pediatrics

## 2017-01-18 LAB — PAP IG, CT-NG NAA, HPV HIGH-RISK
CHLAMYDIA, NUC. ACID AMP: NEGATIVE
GONOCOCCUS BY NUCLEIC ACID AMP: NEGATIVE
PAP SMEAR COMMENT: 0

## 2017-01-18 LAB — HPV, LOW VOLUME (REFLEX): HPV low volume reflex: NEGATIVE

## 2017-01-18 MED ORDER — METRONIDAZOLE 500 MG PO TABS: 500.0000 mg | ORAL_TABLET | Freq: Two times a day (BID) | ORAL | 0 refills | Status: DC

## 2017-01-22 ENCOUNTER — Ambulatory Visit (HOSPITAL_COMMUNITY)
Admission: RE | Admit: 2017-01-22 | Discharge: 2017-01-22 | Disposition: A | Discharge status: Home / Self Care | Payer: Medicare Other | Source: Ambulatory Visit | Attending: Pediatrics | Admitting: Pediatrics

## 2017-01-22 DIAGNOSIS — N6321 Unspecified lump in the left breast, upper outer quadrant: Secondary | ICD-10-CM | POA: Diagnosis not present

## 2017-01-22 DIAGNOSIS — N6489 Other specified disorders of breast: Secondary | ICD-10-CM | POA: Diagnosis not present

## 2017-01-22 DIAGNOSIS — N6320 Unspecified lump in the left breast, unspecified quadrant: Secondary | ICD-10-CM

## 2017-01-22 DIAGNOSIS — R928 Other abnormal and inconclusive findings on diagnostic imaging of breast: Secondary | ICD-10-CM | POA: Diagnosis not present

## 2017-01-22 DIAGNOSIS — N63 Unspecified lump in unspecified breast: Secondary | ICD-10-CM

## 2017-02-06 ENCOUNTER — Telehealth: Payer: Self-pay | Admitting: Pediatrics

## 2017-02-06 NOTE — Telephone Encounter (Signed)
Spoke with patient re abnormal mammogram. She is very worried about doing anything that would "make her less of a woman". She says she doesn't have kids, she does not want to lose any of her breast tissue, doesn't want to have implants if needed. She doesn't want chemotherapy or radiation therapy if anything comes up from biopsy, says she doesn't want to feel any weaker than she already does from the diabetes. Says her blood sugars have been normal last few days. Discussed biopsy procedure, would mean taking a small amount of cells to see if cancer or not. Pt wants to wait to discuss at her next appointment which is in apprx 3 weeks with me.

## 2017-02-28 ENCOUNTER — Ambulatory Visit (INDEPENDENT_AMBULATORY_CARE_PROVIDER_SITE_OTHER): Payer: Medicare Other | Admitting: Pediatrics

## 2017-02-28 ENCOUNTER — Other Ambulatory Visit: Payer: Self-pay | Admitting: Pediatrics

## 2017-02-28 ENCOUNTER — Encounter: Payer: Self-pay | Admitting: Pediatrics

## 2017-02-28 VITALS — BP 108/75 | HR 108 | Temp 97.9°F | Ht 69.0 in | Wt 148.3 lb

## 2017-02-28 DIAGNOSIS — N6320 Unspecified lump in the left breast, unspecified quadrant: Secondary | ICD-10-CM

## 2017-02-28 DIAGNOSIS — N63 Unspecified lump in unspecified breast: Secondary | ICD-10-CM

## 2017-02-28 DIAGNOSIS — E118 Type 2 diabetes mellitus with unspecified complications: Secondary | ICD-10-CM | POA: Diagnosis not present

## 2017-02-28 DIAGNOSIS — Z23 Encounter for immunization: Secondary | ICD-10-CM

## 2017-02-28 LAB — BAYER DCA HB A1C WAIVED: HB A1C (BAYER DCA - WAIVED): 13.3 % — ABNORMAL HIGH (ref <7.0)

## 2017-02-28 NOTE — Progress Notes (Signed)
  Subjective:   Patient ID: Burman Riisatasha C Roehr, female    DOB: 02/09/1966, 51 y.o.   MRN: [id number] CC: Follow-up (3 month)  HPI: Burman Riisatasha C Fouse is a 51 y.o. female presenting for Follow-up (3 month)  DM2:  Was checking BGL twice a day, stopped "because she got tired of it" Eating some bread off and on  Abnormal mammogram: initially pt had declined further tests for abnormal mammogram Says she discussed with a good friend, he encouraged her to get it checked out if needed   Relevant past medical, surgical, family and social history reviewed. Allergies and medications reviewed and updated. Social History   Tobacco Use  Smoking Status Former Smoker  Smokeless Tobacco Never Used   ROS: Per HPI   Objective:    BP 108/75   Pulse (!) 108   Temp 97.9 F (36.6 C) (Oral)   Ht 5\' 9"  (1.753 m)   Wt 148 lb 4.8 oz (67.3 kg)   BMI 21.90 kg/m   Wt Readings from Last 3 Encounters:  02/28/17 148 lb 4.8 oz (67.3 kg)  01/15/17 152 lb (68.9 kg)  12/12/16 148 lb 6.4 oz (67.3 kg)    Gen: NAD, alert, cooperative with exam, NCAT EYES: EOMI, no conjunctival injection, or no icterus ENT:  OP without erythema LYMPH: no cervical LAD CV: NRRR, normal S1/S2, no murmur, distal pulses 2+ b/l Resp: CTABL, no wheezes, normal WOB Abd: +BS, soft, NTND. no guarding or organomegaly Ext: No edema, warm Neuro: Alert and oriented Psych: normal affect  Assessment & Plan:  [name] was seen today for follow-up med problems.  Diagnoses and all orders for this visit:  Diabetes mellitus with complication (HCC) Uncontrolled A1c 13, not checking BGLs regularly Discussed must check BGLs twice a day before insulin Bring BG log to next visit Will set up appt with Genelle for DM2 education Call me for BGL > 250 -     Bayer DCA Hb A1c Waived -     Basic Metabolic Panel  Left breast mass Pt agrees to biopsy Is worried about bad news affecting the upcoming holidays Will have pt f/u with me after  biopsy  Need for immunization against influenza -     Flu Vaccine QUAD 36+ mos IM   Follow up plan: 2 weeks Rex Krasarol Sila Sarsfield, MD [facility]

## 2017-03-01 LAB — BASIC METABOLIC PANEL
BUN/Creatinine Ratio: 20 (ref 9–23)
BUN: 27 mg/dL — ABNORMAL HIGH (ref 6–24)
CHLORIDE: 97 mmol/L (ref 96–106)
CO2: 23 mmol/L (ref 20–29)
CREATININE: 1.35 mg/dL — AB (ref 0.57–1.00)
Calcium: 9.1 mg/dL (ref 8.7–10.2)
GFR calc Af Amer: 53 mL/min/{1.73_m2} — ABNORMAL LOW (ref >59)
GFR calc non Af Amer: 46 mL/min/{1.73_m2} — ABNORMAL LOW (ref >59)
GLUCOSE: 446 mg/dL — AB (ref 65–99)
POTASSIUM: 5 mmol/L (ref 3.5–5.2)
SODIUM: 134 mmol/L (ref 134–144)

## 2017-03-03 ENCOUNTER — Encounter: Payer: Self-pay | Admitting: Pediatrics

## 2017-03-04 ENCOUNTER — Other Ambulatory Visit (HOSPITAL_COMMUNITY): Payer: Self-pay

## 2017-03-08 ENCOUNTER — Ambulatory Visit: Payer: Medicare Other | Admitting: *Deleted

## 2017-03-19 ENCOUNTER — Ambulatory Visit (HOSPITAL_COMMUNITY)
Admission: RE | Admit: 2017-03-19 | Discharge: 2017-03-19 | Disposition: A | Discharge status: Home / Self Care | Payer: Medicare Other | Source: Ambulatory Visit | Attending: Pediatrics | Admitting: Pediatrics

## 2017-03-19 ENCOUNTER — Other Ambulatory Visit: Payer: Self-pay | Admitting: Pediatrics

## 2017-03-19 DIAGNOSIS — N6320 Unspecified lump in the left breast, unspecified quadrant: Secondary | ICD-10-CM | POA: Diagnosis present

## 2017-03-19 DIAGNOSIS — N6321 Unspecified lump in the left breast, upper outer quadrant: Secondary | ICD-10-CM | POA: Diagnosis not present

## 2017-03-19 DIAGNOSIS — R928 Other abnormal and inconclusive findings on diagnostic imaging of breast: Secondary | ICD-10-CM | POA: Diagnosis not present

## 2017-03-19 DIAGNOSIS — N6012 Diffuse cystic mastopathy of left breast: Secondary | ICD-10-CM | POA: Diagnosis not present

## 2017-03-20 ENCOUNTER — Encounter: Payer: Self-pay | Admitting: Pediatrics

## 2017-03-20 ENCOUNTER — Ambulatory Visit (INDEPENDENT_AMBULATORY_CARE_PROVIDER_SITE_OTHER): Payer: Medicare Other | Admitting: Pediatrics

## 2017-03-20 VITALS — BP 135/89 | HR 90 | Temp 97.9°F | Ht 69.0 in | Wt 150.0 lb

## 2017-03-20 DIAGNOSIS — E118 Type 2 diabetes mellitus with unspecified complications: Secondary | ICD-10-CM | POA: Diagnosis not present

## 2017-03-20 DIAGNOSIS — N63 Unspecified lump in unspecified breast: Secondary | ICD-10-CM

## 2017-03-20 NOTE — Progress Notes (Signed)
  Subjective:   Patient ID: Kellie Pearson, female    DOB: 12/25/1965, 51 y.o.   MRN: [id number] CC: Follow-up (Breast Biopsy)  HPI: Kellie Pearson is a 51 y.o. female presenting for Follow-up (Breast Biopsy)  Biopsy yesterday Says it went well Minimal soreness today  DM2: BGLs have been high, she says over 300s Has been eating more sweets than usual Didn't bring in numbers today Says she is not checking regularly, does give herself insulin usually twice a day  Says she felt so great this morning she did 20-30 min of exercising, jumping, stretches at home this morning she felt so good  Relevant past medical, surgical, family and social history reviewed. Allergies and medications reviewed and updated. Social History   Tobacco Use  Smoking Status Former Smoker  Smokeless Tobacco Never Used   ROS: Per HPI   Objective:    BP 135/89   Pulse 90   Temp 97.9 F (36.6 C) (Oral)   Ht 5\' 9"  (1.753 m)   Wt 150 lb (68 kg)   LMP 03/03/2017   BMI 22.15 kg/m   Wt Readings from Last 3 Encounters:  03/20/17 150 lb (68 kg)  02/28/17 148 lb 4.8 oz (67.3 kg)  01/15/17 152 lb (68.9 kg)    Gen: NAD, alert, cooperative with exam, NCAT EYES: EOMI, no conjunctival injection, or no icterus CV: NRRR, normal S1/S2, no murmur, distal pulses 2+ b/l Resp: CTABL, no wheezes, normal WOB Ext: No edema, warm Neuro: Alert and oriented, strength equal b/l UE and LE, coordination grossly normal MSK: normal muscle bulk Psych: normal affect  Assessment & Plan:  Kellie Pearson was seen today for follow-up med problems. Diagnoses and all orders for this visit:  Diabetes mellitus with complication (HCC) Elevated BGLs at home, not checking regularly, says she is giving herself insulin Discussed danger of giving insulin without knowing what her sugar is Will check BID, bring to clinic next week  Breast mass Will discuss biopsy results next week, pt feeling much more comfortable now that biopsy is  over  Follow up plan: Return in about 1 week (around 03/27/2017). Rex Krasarol Patton Swisher, MD [facility]

## 2017-03-20 NOTE — Patient Instructions (Signed)
Check blood sugars before you give yourself insulin twice a day

## 2017-03-27 ENCOUNTER — Ambulatory Visit (INDEPENDENT_AMBULATORY_CARE_PROVIDER_SITE_OTHER): Payer: Medicare Other | Admitting: Pediatrics

## 2017-03-27 ENCOUNTER — Encounter: Payer: Self-pay | Admitting: Pediatrics

## 2017-03-27 VITALS — BP 125/86 | HR 99 | Temp 98.5°F | Ht 69.0 in | Wt 150.8 lb

## 2017-03-27 DIAGNOSIS — N63 Unspecified lump in unspecified breast: Secondary | ICD-10-CM

## 2017-03-27 DIAGNOSIS — E118 Type 2 diabetes mellitus with unspecified complications: Secondary | ICD-10-CM | POA: Diagnosis not present

## 2017-03-27 NOTE — Progress Notes (Signed)
  Subjective:   Patient ID: Kellie Pearson, female    DOB: 05/15/1965, 51 y.o.   MRN: [id number] CC: Follow-up (Biopsy, mammogram)  HPI: Kellie Pearson is a 51 y.o. female presenting for Follow-up (Biopsy, mammogram)  Recent biopsy of left breast mass showed fibrocystic changes, no malignancy Patient pleased with the news  Diabetes: Not checking her blood sugars regularly at home Says today her vision is much more clear than what it has been for a while She was with her parents and has "tasks"than that she has to do every day This sometimes prevents her from taking his good care of herself that she would like When pressed she says that her blood sugars last time she checked was 180, she does not know how long ago that was Has been giving herself insulin most of the time Does not eat regular meals daily Living with her parents who also have diabetes such as with the laundry she gives as example regularly  Relevant past medical, surgical, family and social history reviewed. Allergies and medications reviewed and updated. Social History   Tobacco Use  Smoking Status Former Smoker  Smokeless Tobacco Never Used   ROS: Per HPI   Objective:    BP 125/86   Pulse 99   Temp 98.5 F (36.9 C) (Oral)   Ht 5\' 9"  (1.753 m)   Wt 150 lb 12.8 oz (68.4 kg)   LMP 03/03/2017   BMI 22.27 kg/m   Wt Readings from Last 3 Encounters:  03/27/17 150 lb 12.8 oz (68.4 kg)  03/20/17 150 lb (68 kg)  02/28/17 148 lb 4.8 oz (67.3 kg)    Gen: NAD, alert, cooperative with exam, NCAT EYES: EOMI, no conjunctival injection, or no icterus CV: NRRR, normal S1/S2, no murmur, distal pulses 2+ b/l Resp: CTABL, no wheezes, normal WOB Abd: +BS, soft, NTND. no guarding or organomegaly Ext: No edema, warm Neuro: Alert and oriented, strength equal b/l UE and LE, coordination grossly normal MSK: normal muscle bulk Psych: Normal affect, concrete thinking  Assessment & Plan:  Kellie Pearson was seen today for follow-up  multiple medical problems  Diagnoses and all orders for this visit:  Diabetes mellitus with complication (HCC) Continue to discuss pathophysiology of diabetes Patient has had erratic control  Discussed at length need to check blood sugars while on insulin given risk of hypoglycemia Patient says she believes in healing from the diabetes but is willing to continue to check her blood sugars and bring back to clinic next month if that is what I recommend Offered referral to endocrinology, may be able to do continuous glucose monitoring Patient declines for now Gave chart for keeping track of her blood sugars amount of insulin Discussed option of seeing a counselor to help keep better control of her diabetes, patient declines for now  Breast nodule Negative for malignancy, patient pleased to hear  I spent 25 minutes with the patient with over 50% of the encounter time dedicated to counseling on the above problems.   Follow up plan: 4-5 weeks Rex Krasarol Rakeya Glab, MD [facility]

## 2017-04-26 ENCOUNTER — Ambulatory Visit (INDEPENDENT_AMBULATORY_CARE_PROVIDER_SITE_OTHER): Payer: Medicare Other | Admitting: Pediatrics

## 2017-04-26 ENCOUNTER — Encounter: Payer: Self-pay | Admitting: Pediatrics

## 2017-04-26 VITALS — BP 134/89 | HR 63 | Temp 97.6°F | Ht 69.0 in | Wt 153.0 lb

## 2017-04-26 DIAGNOSIS — E118 Type 2 diabetes mellitus with unspecified complications: Secondary | ICD-10-CM | POA: Diagnosis not present

## 2017-04-26 NOTE — Progress Notes (Signed)
  Subjective:   Patient ID: Kellie Pearson, female    DOB: 05/31/1965, 52 y.o.   MRN: [id number] CC: Follow-up (4 week) Diabetes HPI: Kellie Pearson is a 52 y.o. female presenting for Follow-up (4 week)  Here today for about diabetes follow-up She brought her most recent blood sugar levels with her Checks in the morning, lowest was in the upper 120s, most in the 150s up to 200s, highest over 300 Checking also in the evening, ranging 110s to upper 100s-low 200s  She says she has been under increased stress with taking care of her sister in addition to her parents.  Her sister recently had surgery on her hand after a major accident  Relevant past medical, surgical, family and social history reviewed. Allergies and medications reviewed and updated. Social History   Tobacco Use  Smoking Status Former Smoker  Smokeless Tobacco Never Used   ROS: Per HPI   Objective:    BP 134/89   Pulse 63   Temp 97.6 F (36.4 C) (Oral)   Ht 5\' 9"  (1.753 m)   Wt 153 lb (69.4 kg)   BMI 22.59 kg/m   Wt Readings from Last 3 Encounters:  04/26/17 153 lb (69.4 kg)  03/27/17 150 lb 12.8 oz (68.4 kg)  03/20/17 150 lb (68 kg)   Gen: NAD, alert, cooperative with exam, NCAT EYES: EOMI, no conjunctival injection, or no icterus ENT: OP without erythema LYMPH: no cervical LAD CV: NRRR, normal S1/S2, no murmur, distal pulses 2+ b/l Resp: CTABL, no wheezes, normal WOB Abd: +BS, soft, NTND.  Ext: No edema, warm Neuro: Alert and oriented, strength equal b/l UE and LE, coordination grossly normal MSK: normal muscle bulk  Assessment & Plan:  Kellie Pearson was seen today for follow-up DM2  Diagnoses and all orders for this visit:  Diabetes mellitus with complication (HCC) Education provided, morning blood sugars persistently elevated.  Will increase evening dose of insulin to 18 units, continue 18 units during the day.  Continue to check blood sugar levels morning and night before dosing insulin.. Call our  office for numbers less than 80 and for numbers greater than 250.   Follow up plan: Return in about 5 weeks (around 05/31/2017). Rex Krasarol Vincent, MD [facility]

## 2017-06-24 ENCOUNTER — Ambulatory Visit (INDEPENDENT_AMBULATORY_CARE_PROVIDER_SITE_OTHER): Payer: Medicare Other | Admitting: Pediatrics

## 2017-06-24 ENCOUNTER — Encounter: Payer: Self-pay | Admitting: Pediatrics

## 2017-06-24 VITALS — BP 130/85 | HR 97 | Temp 97.4°F | Ht 69.0 in | Wt 154.0 lb

## 2017-06-24 DIAGNOSIS — E118 Type 2 diabetes mellitus with unspecified complications: Secondary | ICD-10-CM

## 2017-06-24 DIAGNOSIS — M25512 Pain in left shoulder: Secondary | ICD-10-CM

## 2017-06-24 LAB — BAYER DCA HB A1C WAIVED: HB A1C: 12.5 % — AB (ref <7.0)

## 2017-06-24 NOTE — Progress Notes (Signed)
  Subjective:   Patient ID: Kellie Pearson, female    DOB: 11/16/1965, 52 y.o.   MRN: [id number] CC: Follow-up (3 month) multiple med problems HPI: Kellie Pearson is a 52 y.o. female presenting for Follow-up (3 month)  Dm2: drinking unsweet tea or water. Didn't bring BGLs with her.  Taking 18u in the morning, 15u at night of novolog 70/30. Says she can't eat what she wants to, family either eats the food that she buys or throws it away. She lives with her parents. Says she is not able to do anything to improve her diet at this time because she can't change her living situation.  L shoulder pain: 4 weeks ago started having pain L shoulder. Wasn't able to move it normally. Now back to normal though cant move her L arm as much as R arm.   Relevant past medical, surgical, family and social history reviewed. Allergies and medications reviewed and updated. Social History   Tobacco Use  Smoking Status Former Smoker  Smokeless Tobacco Never Used   ROS: Per HPI   Objective:    BP 130/85   Pulse 97   Temp (!) 97.4 F (36.3 C) (Oral)   Ht 5\' 9"  (1.753 m)   Wt 154 lb (69.9 kg)   BMI 22.74 kg/m   Wt Readings from Last 3 Encounters:  06/24/17 154 lb (69.9 kg)  04/26/17 153 lb (69.4 kg)  03/27/17 150 lb 12.8 oz (68.4 kg)    Gen: NAD, alert, cooperative with exam, NCAT EYES: EOMI, no conjunctival injection, or no icterus LYMPH: no cervical LAD CV: NRRR, normal S1/S2, no murmur, distal pulses 2+ b/l Resp: CTABL, no wheezes, normal WOB Ext: No edema, warm Neuro: Alert and oriented MSK: L shoulder ROM normal, no pain with internal, external rotation against resistance. Rotator cuff muscles intact b/l arms. No point tenderness over AC joint, biceps groove L arm. With outstretched arms L hand with slightly decrease supination compared with R arm. Assessment & Plan:  Kellie Pearson was seen today for follow-up med problems  Diagnoses and all orders for this visit:  Diabetes mellitus with  complication (HCC) A1c 12.5. D/w pt, average blood sugars over 300. discussed this will cause worsening neuropathy in feet, future problems with eyes and kidneys if not improved. Pt says she can't change her family, can't live elsewhere because of the [place] government. When asked what she thinks would be possible to do at home to improve her blood sugars including checking BGL regularly, taking insulin more regularly, avoiding sugary foods, she says she cannot think of anything she can do to improve her blood sugars. Unclear if she is checking BGL regularly. Compliance with insulin has been intermittent past few months. Offered referral to nutrition to work on good food choices within her constraints of family, pt declines. offered further diabetic education, pt declined. Encouraged her to check BGL twice a day, take insulin twice a day, bring numbers to clinic in 4 weeks. -     Bayer DCA Hb A1c Waived  Acute pain of left shoulder Improved now. With outstretched arm slightly decreased supination L hand compared to R but otherwise normal exam. Will continue to avoid movements that make pain worse.  Follow up plan: Return in about 4 weeks (around 07/22/2017) for diabetes follow up. Rex Krasarol Vincent, MD [facility]

## 2017-06-24 NOTE — Patient Instructions (Signed)
Bring blood sugar log with you next visit.

## 2017-07-08 ENCOUNTER — Emergency Department (HOSPITAL_COMMUNITY)
Admission: EM | Admit: 2017-07-08 | Discharge: 2017-07-08 | Disposition: A | Discharge status: Home / Self Care | Payer: Medicare Other | Attending: Emergency Medicine | Admitting: Emergency Medicine

## 2017-07-08 ENCOUNTER — Emergency Department (HOSPITAL_COMMUNITY): Payer: Medicare Other

## 2017-07-08 ENCOUNTER — Encounter (HOSPITAL_COMMUNITY): Payer: Self-pay | Admitting: Emergency Medicine

## 2017-07-08 DIAGNOSIS — Z7982 Long term (current) use of aspirin: Secondary | ICD-10-CM | POA: Insufficient documentation

## 2017-07-08 DIAGNOSIS — Z794 Long term (current) use of insulin: Secondary | ICD-10-CM | POA: Insufficient documentation

## 2017-07-08 DIAGNOSIS — E1140 Type 2 diabetes mellitus with diabetic neuropathy, unspecified: Secondary | ICD-10-CM | POA: Insufficient documentation

## 2017-07-08 DIAGNOSIS — M79604 Pain in right leg: Secondary | ICD-10-CM | POA: Diagnosis present

## 2017-07-08 DIAGNOSIS — M25551 Pain in right hip: Secondary | ICD-10-CM | POA: Diagnosis not present

## 2017-07-08 DIAGNOSIS — M5410 Radiculopathy, site unspecified: Secondary | ICD-10-CM | POA: Diagnosis not present

## 2017-07-08 DIAGNOSIS — Z79899 Other long term (current) drug therapy: Secondary | ICD-10-CM | POA: Insufficient documentation

## 2017-07-08 DIAGNOSIS — Z87891 Personal history of nicotine dependence: Secondary | ICD-10-CM | POA: Insufficient documentation

## 2017-07-08 DIAGNOSIS — M76891 Other specified enthesopathies of right lower limb, excluding foot: Secondary | ICD-10-CM | POA: Diagnosis not present

## 2017-07-08 LAB — I-STAT BETA HCG BLOOD, ED (MC, WL, AP ONLY): I-stat hCG, quantitative: <5 m[IU]/mL (ref <5)

## 2017-07-08 MED ORDER — IBUPROFEN 400 MG PO TABS
400.0000 mg | ORAL_TABLET | Freq: Once | ORAL | Status: AC
  Administered 2017-07-08: 400 mg via ORAL
  Filled 2017-07-08: qty 1

## 2017-07-08 MED ORDER — MELOXICAM 15 MG PO TABS: 15.0000 mg | ORAL_TABLET | Freq: Every day | ORAL | 0 refills | Status: AC

## 2017-07-08 NOTE — ED Triage Notes (Signed)
Pt reports right leg pain from hip down x 2 weeks with no injury.

## 2017-07-08 NOTE — Discharge Instructions (Signed)
Please rest for the next 2-3 days. Use crutches to stay off the right leg.  Ice the affected area for 20 minutes at a time, several times a day Take Meloxicam once daily for pain and inflammation Please follow up with your doctor

## 2017-07-08 NOTE — ED Provider Notes (Signed)
[facility] EMERGENCY DEPARTMENT Provider Note   CSN: [id number] Arrival date & time: 07/08/17  1010     History   Chief Complaint Chief Complaint  Patient presents with  . Leg Pain    HPI Kellie Pearson is a 52 y.o. female who presents with right leg pain.  Past medical history significant for insulin-dependent diabetes, peripheral neuropathy.  Patient states that she has had pain originating in her right hip radiating to her right foot for several weeks.  She states the pain has been mild and she has been ignoring it.  She has not seen her primary care doctor for this issue.  The pain has worsened over the past couple of days and she has difficulty getting in a comfortable position and walking.  She is able to walk without limp.  The pain is worse with movement of her hip and lying on her side.  She states that she took ibuprofen once for the pain which put her to sleep.  She takes gabapentin for neuropathy which has not been helpful.  She denies fever, back pain, numbness or tingling, bowel or bladder incontinence.  She has never had this pain before  HPI  Past Medical History:  Diagnosis Date  . Abscess    admitted 5/14  . Diabetes mellitus without complication ([place])   . Hidradenitis suppurativa     Patient Active Problem List   Diagnosis Date Noted  . Retinopathy due to secondary diabetes ([place]) 10/11/2016  . Delusional disorder ([place]) 08/21/2016  . Acute lower UTI 08/20/2016  . Hyponatremia 06/15/2016  . Diabetes mellitus with complication ([place]) 96/78/9381  . Peripheral neuropathy 06/15/2016  . Homelessness 06/15/2016  . Leukocytosis 06/15/2016  . Perirectal abscess   . Hidradenitis suppurativa   . Tachycardia 09/01/2012  . Skin abscess 09/01/2012    Past Surgical History:  Procedure Laterality Date  . BACK SURGERY      OB History    Gravida Para Term Preterm AB Living   0 0 0 0 0 0   SAB TAB Ectopic Multiple Live Births   0 0 0 0 0       Home  Medications    Prior to Admission medications   Medication Sig Start Date End Date Taking? Authorizing Provider  aspirin EC 81 MG tablet Take 81 mg by mouth daily.    [provider]  Blood Glucose Monitoring Suppl (ONETOUCH VERIO) w/Device KIT 1 kit by Does not apply route 5 (five) times daily. 11/29/16   Eustaquio Maize, MD  glucose blood (ONETOUCH VERIO) test strip Use 4 times daily. 11/29/16   Eustaquio Maize, MD  insulin aspart protamine- aspart (NOVOLOG MIX 70/30) (70-30) 100 UNIT/ML injection Take 18 units in the morning, 15 units at night 10/08/16   Eustaquio Maize, MD  Multiple Vitamin (MULTIVITAMIN WITH MINERALS) TABS tablet Take 1 tablet by mouth daily. 06/19/16   Isaac Bliss, Rayford Halsted, MD  ONE TOUCH LANCETS MISC 1 each by Does not apply route 4 (four) times daily. 08/29/16   Eustaquio Maize, MD    Family History Family History  Problem Relation Age of Onset  . Diabetes Mother   . Diabetes Father     Social History Social History   Tobacco Use  . Smoking status: Former Research scientist (life sciences)  . Smokeless tobacco: Never Used  Substance Use Topics  . Alcohol use: No  . Drug use: No     Allergies   Patient has no known allergies.  Review of Systems Review of Systems  Constitutional: Negative for fever.  Gastrointestinal: Negative for abdominal pain and constipation.  Genitourinary: Negative for difficulty urinating.  Musculoskeletal: Positive for arthralgias, gait problem ([place]) and myalgias. Negative for back pain.  Neurological: Negative for weakness and numbness.  All other systems reviewed and are negative.    Physical Exam Updated Vital Signs BP 113/76   Pulse 98   Temp 98.7 F (37.1 C) (Oral)   Ht _0  (1.727 m)   Wt 68 kg (150 lb)   LMP 07/04/2017   SpO2 99%   BMI 22.81 kg/m   Physical Exam  Constitutional: She is oriented to person, place, and time. She appears well-developed and well-nourished. No distress.  HENT:  Head: Normocephalic and  atraumatic.  Eyes: Conjunctivae are normal. Pupils are equal, round, and reactive to light. Right eye exhibits no discharge. Left eye exhibits no discharge. No scleral icterus.  Neck: Normal range of motion.  Cardiovascular: Normal rate and regular rhythm.  Pulmonary/Chest: Effort normal and breath sounds normal. No respiratory distress.  Abdominal: She exhibits no distension.  Musculoskeletal:  No back tenderness  Right hip and leg: No obvious swelling, deformity, or warmth. Tenderness to palpation of lateral right hip. Decreased ROM of hip due to pain. 5/5 strength. N/V intact.  Left hip: FROM. Non-tender   Neurological: She is alert and oriented to person, place, and time.  Skin: Skin is warm and dry.  Psychiatric: She has a normal mood and affect.  Odd affect  Nursing note and vitals reviewed.    ED Treatments / Results  Labs (all labs ordered are listed, but only abnormal results are displayed) Labs Reviewed  I-STAT BETA HCG BLOOD, ED ([facility], [facility], [facility] ONLY)    EKG  EKG Interpretation None       Radiology Dg Hip Unilat W Or Wo Pelvis 2-3 Views Right  Result Date: 07/08/2017 CLINICAL DATA:  Lateral RIGHT hip pain for 3-4 weeks, worsening. No known injury. EXAM: DG HIP (WITH OR WITHOUT PELVIS) 2-3V RIGHT COMPARISON:  None. FINDINGS: There is no evidence of hip fracture or dislocation. There is no evidence of arthropathy or other focal bone abnormality. There is calcific tendinosis of the RIGHT gluteus minimus/medius insertion. This could be a source of inflammation and pain. Correlate clinically. IMPRESSION: Calcific tendinosis of the RIGHT gluteus minimus/medius insertion. See discussion above. Electronically Signed   By: Staci Righter M.D.   On: 07/08/2017 12:53    Procedures Procedures (including critical care time)  Medications Ordered in ED Medications  ibuprofen (ADVIL,MOTRIN) tablet 400 mg (400 mg Oral Given 07/08/17 1205)     Initial Impression / Assessment and  Plan / ED Course  I have reviewed the triage vital signs and the nursing notes.  Pertinent labs & imaging results that were available during my care of the patient were reviewed by me and considered in my medical decision making (see chart for details).  52 year old female presents with worsening right hip pain with radicular pain to the foot.  Her vital signs are normal.  She does not have tenderness of the hip on exam however pain is elicited with range of motion of the hip.  She is ambulatory with a limp.  She is neurovascularly intact.  The patient is a somewhat of a poor historian and has an odd affect.  X-ray was ordered.  She was given ibuprofen for pain.  X-rays remarkable for calcific tendinosis of the right gluteus muscle.  She was advised  to rest, ice the area, take meloxicam for the next several days.  She was also advised to follow-up with her doctor.  She was given crutches per her request and was advised to not use this for more than 3 days.  Final Clinical Impressions(s) / ED Diagnoses   Final diagnoses:  Right hip pain  Radicular pain  Tendonitis of right hip    ED Discharge Orders    None       Recardo Evangelist, PA-C 07/08/17 1325    Julianne Rice, MD 07/08/17 1537

## 2019-02-22 DIAGNOSIS — 419620001 Death: Secondary | SNOMED CT | POA: Diagnosis not present

## 2019-02-22 DEATH — deceased

## 2019-10-29 IMAGING — DX DG HIP (WITH OR WITHOUT PELVIS) 2-3V*R*
3 series · 3 of 3 positions shown · non-contrast
Comparison: None.

CLINICAL DATA: Lateral RIGHT hip pain for 3-4 weeks, worsening. No
known injury.

EXAM:
DG HIP (WITH OR WITHOUT PELVIS) 2-3V RIGHT

[pelvis ap]
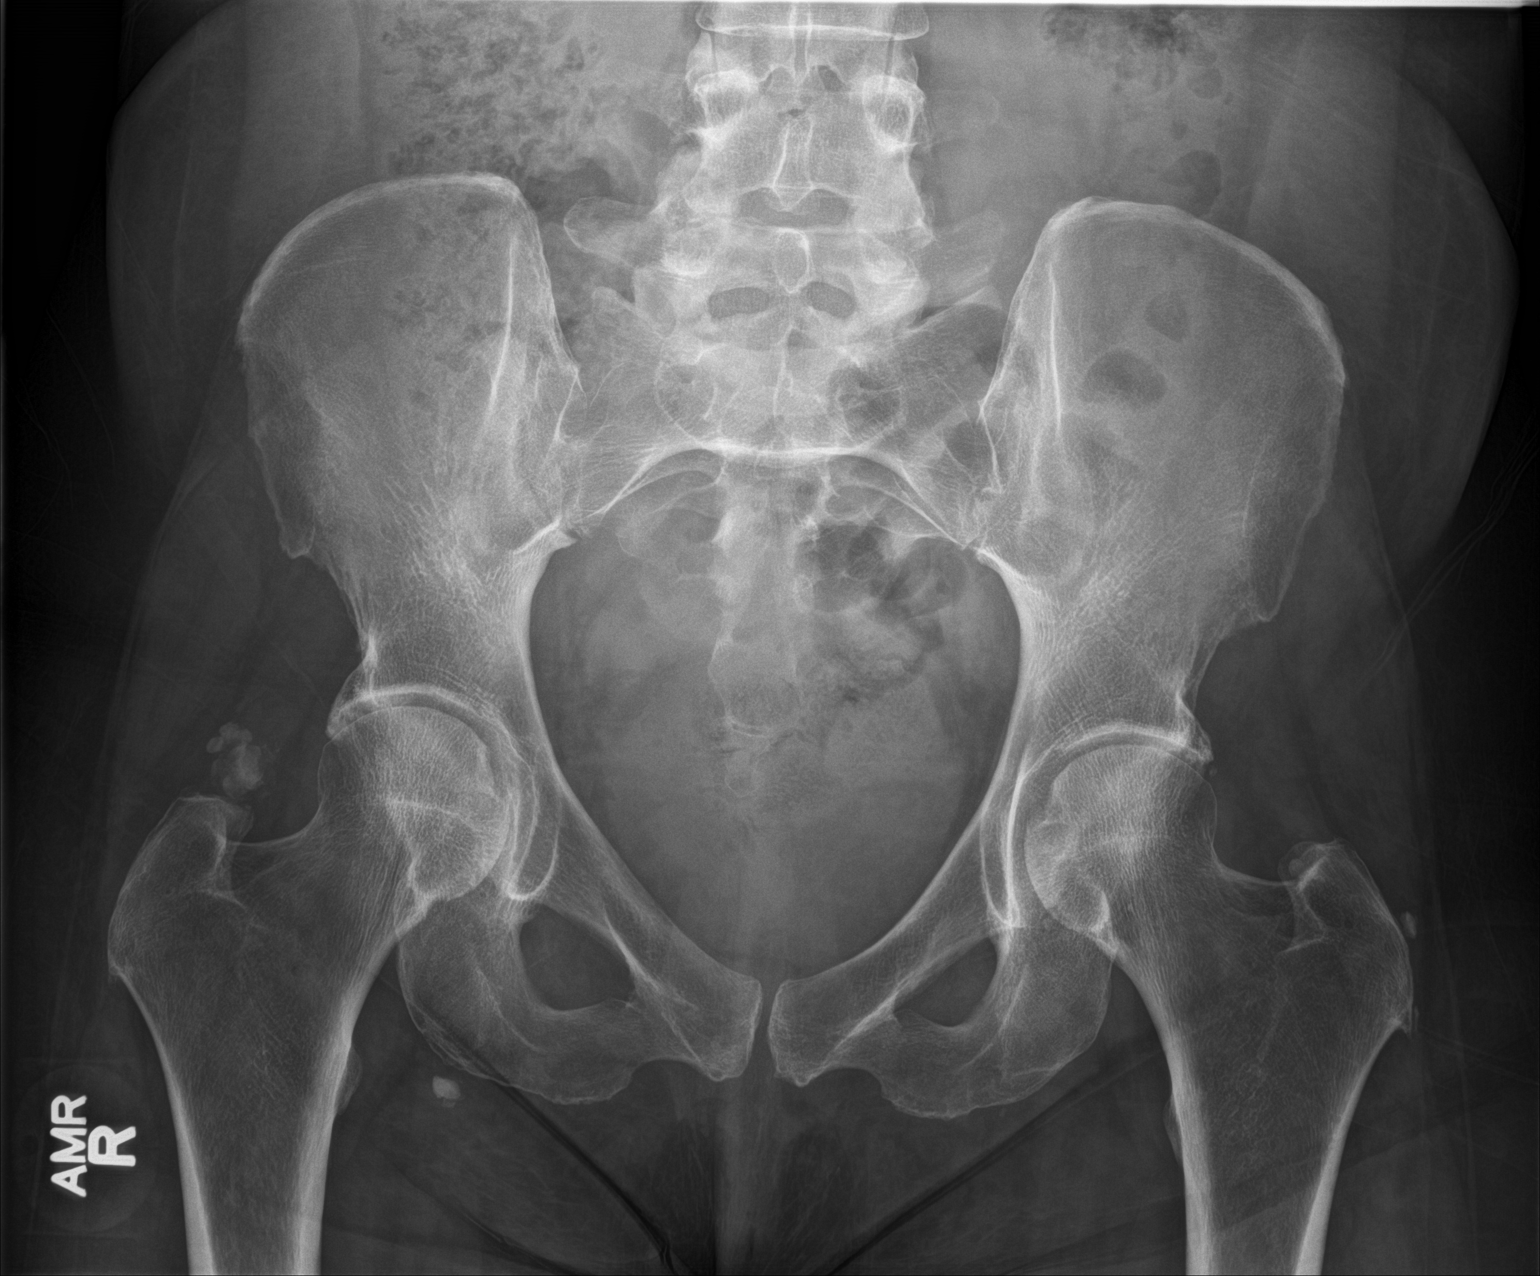

[hip ap]
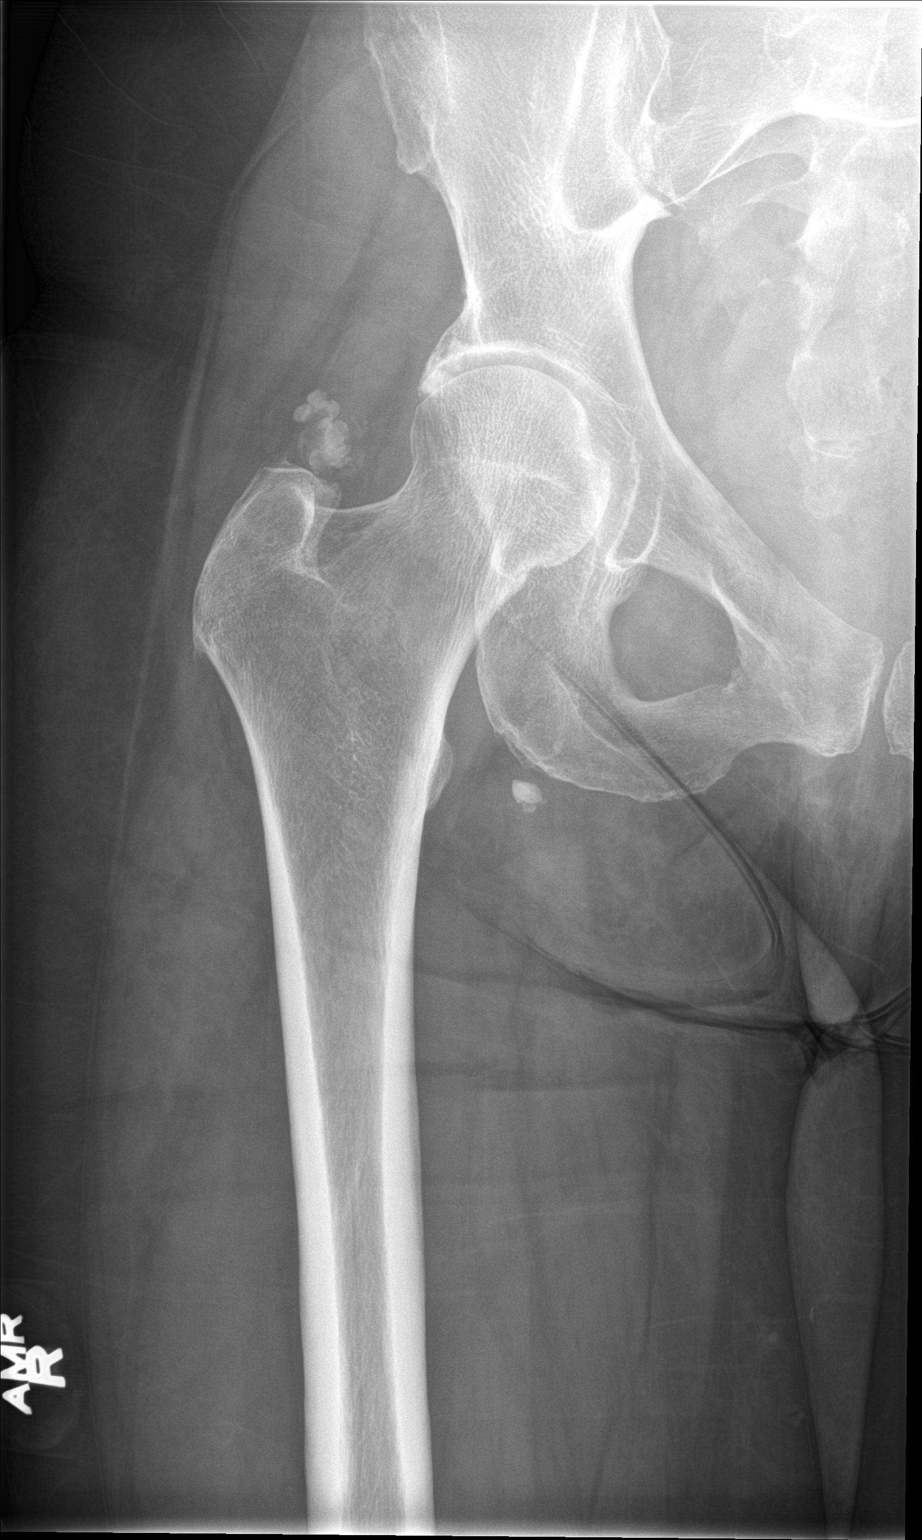

[hip lat]
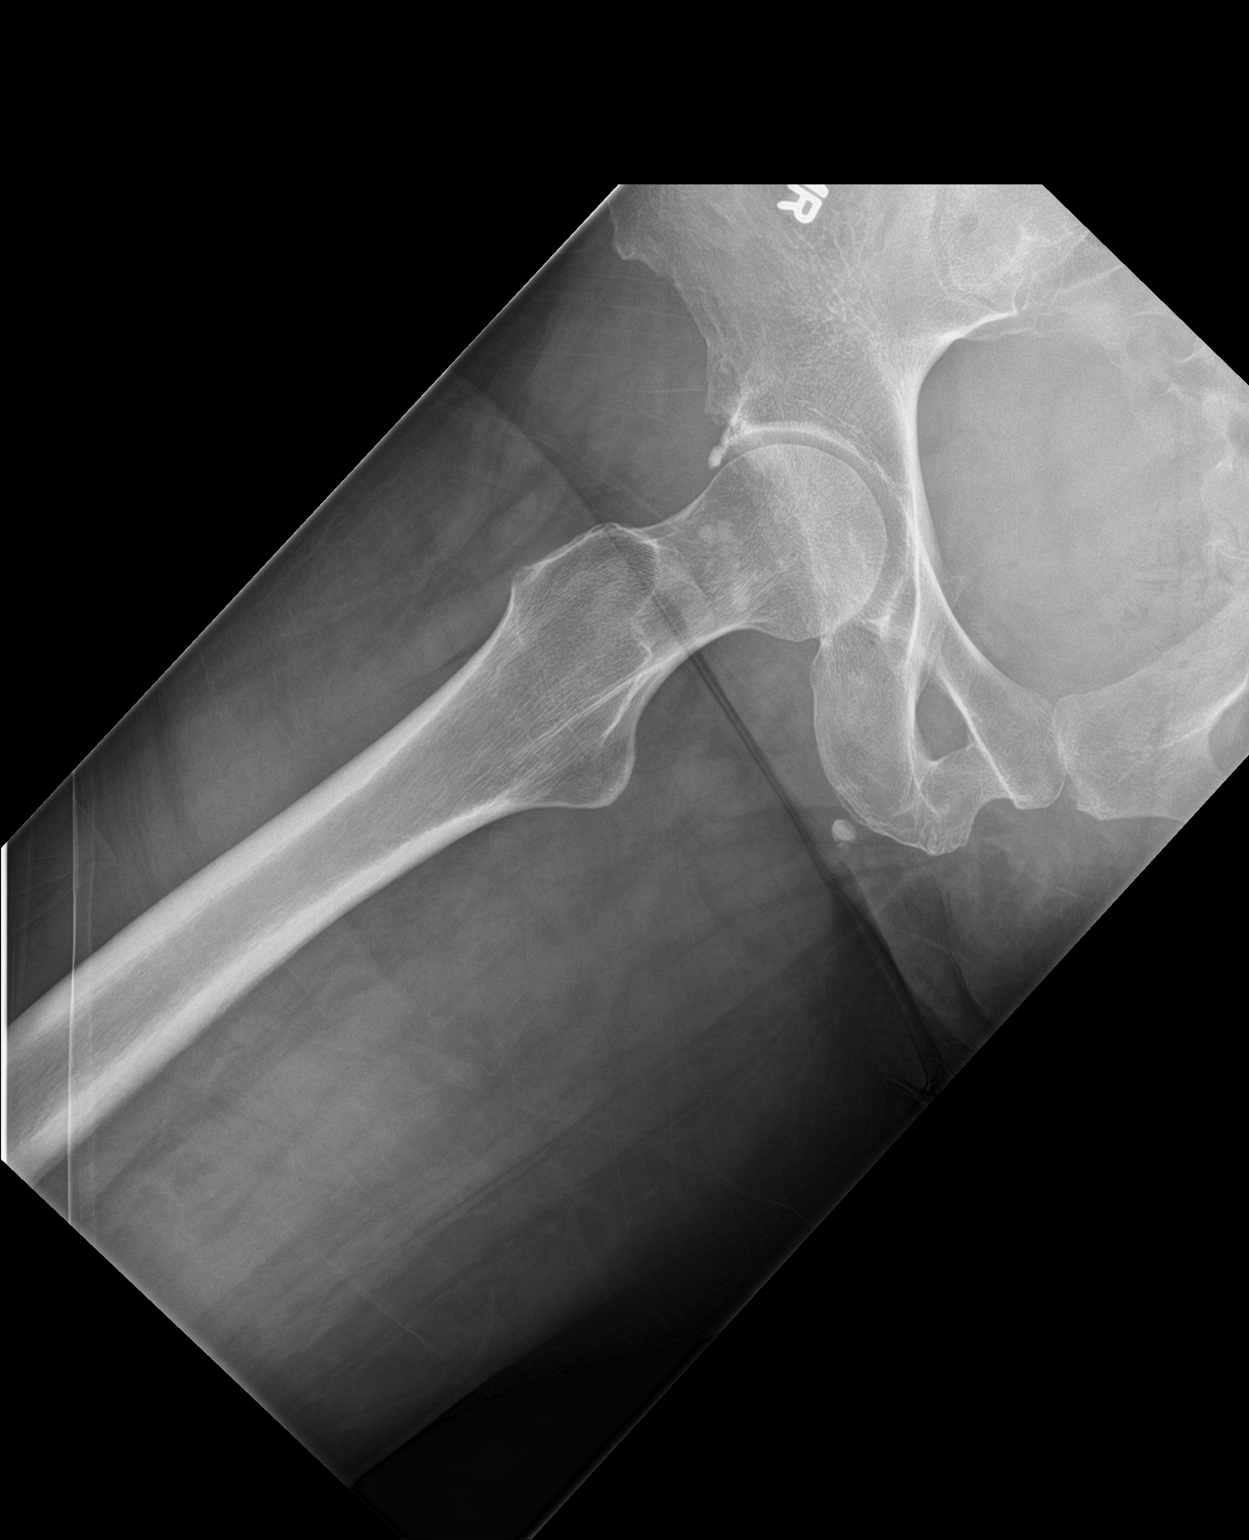

[3 of 3 positions shown; findings below may reference images not displayed]

FINDINGS: There is no evidence of hip fracture or dislocation. There is no
evidence of arthropathy or other focal bone abnormality.

There is calcific tendinosis of the RIGHT gluteus minimus/medius
insertion. This could be a source of inflammation and pain.
Correlate clinically.
IMPRESSION: Calcific tendinosis of the RIGHT gluteus minimus/medius insertion.
See discussion above.
